# Patient Record
Sex: Female | Born: 1975 | Race: Black or African American | Hispanic: No | Marital: Single | Smoking: Never smoker
Health system: Southern US, Community
[De-identification: ages and names within clinical notes are randomized; demographics above are authoritative.]

## PROBLEM LIST (undated history)

## (undated) DIAGNOSIS — IMO0002 Reserved for concepts with insufficient information to code with codable children: Secondary | ICD-10-CM

## (undated) DIAGNOSIS — A749 Chlamydial infection, unspecified: Secondary | ICD-10-CM

## (undated) DIAGNOSIS — Z9889 Other specified postprocedural states: Secondary | ICD-10-CM

## (undated) DIAGNOSIS — Z8742 Personal history of other diseases of the female genital tract: Secondary | ICD-10-CM

## (undated) DIAGNOSIS — K219 Gastro-esophageal reflux disease without esophagitis: Secondary | ICD-10-CM

## (undated) DIAGNOSIS — N921 Excessive and frequent menstruation with irregular cycle: Secondary | ICD-10-CM

## (undated) DIAGNOSIS — B9689 Other specified bacterial agents as the cause of diseases classified elsewhere: Secondary | ICD-10-CM

## (undated) DIAGNOSIS — B379 Candidiasis, unspecified: Secondary | ICD-10-CM

## (undated) DIAGNOSIS — N926 Irregular menstruation, unspecified: Secondary | ICD-10-CM

## (undated) DIAGNOSIS — A499 Bacterial infection, unspecified: Secondary | ICD-10-CM

## (undated) DIAGNOSIS — N870 Mild cervical dysplasia: Secondary | ICD-10-CM

## (undated) DIAGNOSIS — A599 Trichomoniasis, unspecified: Secondary | ICD-10-CM

## (undated) DIAGNOSIS — D219 Benign neoplasm of connective and other soft tissue, unspecified: Secondary | ICD-10-CM

## (undated) DIAGNOSIS — Z8619 Personal history of other infectious and parasitic diseases: Secondary | ICD-10-CM

## (undated) DIAGNOSIS — R339 Retention of urine, unspecified: Secondary | ICD-10-CM

## (undated) DIAGNOSIS — N760 Acute vaginitis: Secondary | ICD-10-CM

## (undated) HISTORY — DX: Acute vaginitis: N76.0

## (undated) HISTORY — DX: Personal history of other diseases of the female genital tract: Z87.42

## (undated) HISTORY — DX: Other specified postprocedural states: Z98.890

## (undated) HISTORY — DX: Candidiasis, unspecified: B37.9

## (undated) HISTORY — DX: Personal history of other infectious and parasitic diseases: Z86.19

## (undated) HISTORY — DX: Excessive and frequent menstruation with irregular cycle: N92.1

## (undated) HISTORY — DX: Chlamydial infection, unspecified: A74.9

## (undated) HISTORY — PX: CERVICAL BIOPSY  W/ LOOP ELECTRODE EXCISION: SUR135

## (undated) HISTORY — DX: Trichomoniasis, unspecified: A59.9

## (undated) HISTORY — DX: Benign neoplasm of connective and other soft tissue, unspecified: D21.9

## (undated) HISTORY — DX: Reserved for concepts with insufficient information to code with codable children: IMO0002

## (undated) HISTORY — DX: Bacterial infection, unspecified: A49.9

## (undated) HISTORY — DX: Irregular menstruation, unspecified: N92.6

## (undated) HISTORY — PX: DILATION AND CURETTAGE OF UTERUS: SHX78

## (undated) HISTORY — DX: Mild cervical dysplasia: N87.0

## (undated) HISTORY — DX: Other specified bacterial agents as the cause of diseases classified elsewhere: B96.89

---

## 1999-07-11 ENCOUNTER — Emergency Department (HOSPITAL_COMMUNITY): Admission: EM | Admit: 1999-07-11 | Discharge: 1999-07-11 | Payer: Self-pay | Admitting: Emergency Medicine

## 2000-03-02 ENCOUNTER — Other Ambulatory Visit: Admission: RE | Admit: 2000-03-02 | Discharge: 2000-03-02 | Payer: Self-pay | Admitting: Obstetrics & Gynecology

## 2000-07-16 ENCOUNTER — Encounter: Payer: Self-pay | Admitting: Emergency Medicine

## 2000-07-16 ENCOUNTER — Emergency Department (HOSPITAL_COMMUNITY): Admission: EM | Admit: 2000-07-16 | Discharge: 2000-07-16 | Payer: Self-pay | Admitting: Emergency Medicine

## 2000-09-15 ENCOUNTER — Emergency Department (HOSPITAL_COMMUNITY): Admission: EM | Admit: 2000-09-15 | Discharge: 2000-09-15 | Payer: Self-pay | Admitting: Emergency Medicine

## 2002-05-31 ENCOUNTER — Other Ambulatory Visit: Admission: RE | Admit: 2002-05-31 | Discharge: 2002-05-31 | Payer: Self-pay | Admitting: Obstetrics and Gynecology

## 2002-08-02 DIAGNOSIS — IMO0002 Reserved for concepts with insufficient information to code with codable children: Secondary | ICD-10-CM

## 2002-08-02 DIAGNOSIS — R87619 Unspecified abnormal cytological findings in specimens from cervix uteri: Secondary | ICD-10-CM

## 2002-08-02 HISTORY — DX: Unspecified abnormal cytological findings in specimens from cervix uteri: R87.619

## 2002-08-02 HISTORY — DX: Reserved for concepts with insufficient information to code with codable children: IMO0002

## 2003-01-08 ENCOUNTER — Other Ambulatory Visit: Admission: RE | Admit: 2003-01-08 | Discharge: 2003-01-08 | Payer: Self-pay | Admitting: Obstetrics and Gynecology

## 2003-07-06 ENCOUNTER — Other Ambulatory Visit: Admission: RE | Admit: 2003-07-06 | Discharge: 2003-07-06 | Payer: Self-pay | Admitting: Obstetrics and Gynecology

## 2003-07-09 ENCOUNTER — Other Ambulatory Visit: Admission: RE | Admit: 2003-07-09 | Discharge: 2003-07-09 | Payer: Self-pay | Admitting: Obstetrics and Gynecology

## 2004-09-07 ENCOUNTER — Other Ambulatory Visit: Admission: RE | Admit: 2004-09-07 | Discharge: 2004-09-07 | Payer: Self-pay | Admitting: Obstetrics and Gynecology

## 2004-09-08 ENCOUNTER — Other Ambulatory Visit: Admission: RE | Admit: 2004-09-08 | Discharge: 2004-09-08 | Payer: Self-pay | Admitting: Obstetrics and Gynecology

## 2005-09-06 ENCOUNTER — Other Ambulatory Visit: Admission: RE | Admit: 2005-09-06 | Discharge: 2005-09-06 | Payer: Self-pay | Admitting: Obstetrics and Gynecology

## 2005-10-25 ENCOUNTER — Ambulatory Visit: Payer: Self-pay | Admitting: Internal Medicine

## 2005-12-31 DIAGNOSIS — N760 Acute vaginitis: Secondary | ICD-10-CM

## 2005-12-31 DIAGNOSIS — B9689 Other specified bacterial agents as the cause of diseases classified elsewhere: Secondary | ICD-10-CM

## 2005-12-31 HISTORY — DX: Other specified bacterial agents as the cause of diseases classified elsewhere: N76.0

## 2005-12-31 HISTORY — DX: Other specified bacterial agents as the cause of diseases classified elsewhere: B96.89

## 2006-01-10 DIAGNOSIS — D219 Benign neoplasm of connective and other soft tissue, unspecified: Secondary | ICD-10-CM

## 2006-01-10 HISTORY — DX: Benign neoplasm of connective and other soft tissue, unspecified: D21.9

## 2006-09-23 DIAGNOSIS — N921 Excessive and frequent menstruation with irregular cycle: Secondary | ICD-10-CM

## 2006-09-23 HISTORY — DX: Excessive and frequent menstruation with irregular cycle: N92.1

## 2006-10-18 ENCOUNTER — Ambulatory Visit: Payer: Self-pay | Admitting: Internal Medicine

## 2006-10-18 LAB — CONVERTED CEMR LAB: hCG, Beta Chain, Quant, S: <0.5 milliintl units/mL

## 2007-01-07 ENCOUNTER — Inpatient Hospital Stay (HOSPITAL_COMMUNITY): Admission: AD | Admit: 2007-01-07 | Discharge: 2007-01-07 | Payer: Self-pay | Admitting: Obstetrics and Gynecology

## 2007-01-10 ENCOUNTER — Encounter: Payer: Self-pay | Admitting: Internal Medicine

## 2007-01-10 LAB — CONVERTED CEMR LAB: hCG, Beta Chain, Quant, S: 535.38 milliintl units/mL

## 2007-06-23 ENCOUNTER — Ambulatory Visit: Payer: Self-pay | Admitting: Internal Medicine

## 2007-06-23 LAB — CONVERTED CEMR LAB

## 2007-08-10 ENCOUNTER — Telehealth: Payer: Self-pay | Admitting: Internal Medicine

## 2007-09-28 DIAGNOSIS — Z8742 Personal history of other diseases of the female genital tract: Secondary | ICD-10-CM

## 2007-09-28 HISTORY — DX: Personal history of other diseases of the female genital tract: Z87.42

## 2008-02-20 ENCOUNTER — Encounter: Payer: Self-pay | Admitting: Internal Medicine

## 2008-02-20 DIAGNOSIS — N912 Amenorrhea, unspecified: Secondary | ICD-10-CM

## 2008-02-21 ENCOUNTER — Ambulatory Visit: Payer: Self-pay | Admitting: Internal Medicine

## 2008-02-21 LAB — CONVERTED CEMR LAB: hCG, Beta Chain, Quant, S: 0.55 milliintl units/mL

## 2008-03-12 ENCOUNTER — Emergency Department (HOSPITAL_COMMUNITY): Admission: EM | Admit: 2008-03-12 | Discharge: 2008-03-12 | Payer: Self-pay | Admitting: Family Medicine

## 2008-03-19 ENCOUNTER — Encounter: Payer: Self-pay | Admitting: Internal Medicine

## 2008-12-09 ENCOUNTER — Encounter: Admission: RE | Admit: 2008-12-09 | Discharge: 2008-12-09 | Payer: Self-pay | Admitting: Obstetrics and Gynecology

## 2010-07-15 ENCOUNTER — Ambulatory Visit (HOSPITAL_COMMUNITY): Admission: RE | Admit: 2010-07-15 | Payer: Self-pay | Admitting: Obstetrics and Gynecology

## 2011-04-30 LAB — KOH PREP: KOH Prep: NONE SEEN

## 2011-04-30 LAB — CULTURE, FUNGUS WITHOUT SMEAR

## 2011-05-20 LAB — CBC
MCHC: 32.9
MCV: 86.6
RBC: 3.98
RDW: 15.2 — ABNORMAL HIGH

## 2011-05-20 LAB — HCG, QUANTITATIVE, PREGNANCY: hCG, Beta Chain, Quant, S: 1559 — ABNORMAL HIGH

## 2011-05-31 DIAGNOSIS — N870 Mild cervical dysplasia: Secondary | ICD-10-CM

## 2011-05-31 HISTORY — DX: Mild cervical dysplasia: N87.0

## 2011-12-02 ENCOUNTER — Encounter: Payer: Self-pay | Admitting: Obstetrics and Gynecology

## 2011-12-03 ENCOUNTER — Encounter: Payer: Self-pay | Admitting: Obstetrics and Gynecology

## 2011-12-16 ENCOUNTER — Encounter: Payer: Self-pay | Admitting: Obstetrics and Gynecology

## 2011-12-16 DIAGNOSIS — Z8742 Personal history of other diseases of the female genital tract: Secondary | ICD-10-CM | POA: Insufficient documentation

## 2011-12-16 DIAGNOSIS — D219 Benign neoplasm of connective and other soft tissue, unspecified: Secondary | ICD-10-CM | POA: Insufficient documentation

## 2011-12-16 DIAGNOSIS — Z87898 Personal history of other specified conditions: Secondary | ICD-10-CM

## 2011-12-16 DIAGNOSIS — B009 Herpesviral infection, unspecified: Secondary | ICD-10-CM | POA: Insufficient documentation

## 2011-12-17 ENCOUNTER — Encounter: Payer: Self-pay | Admitting: Obstetrics and Gynecology

## 2011-12-17 ENCOUNTER — Ambulatory Visit (INDEPENDENT_AMBULATORY_CARE_PROVIDER_SITE_OTHER): Payer: 59 | Admitting: Obstetrics and Gynecology

## 2011-12-17 VITALS — BP 102/62 | Ht 63.0 in | Wt 150.0 lb

## 2011-12-17 DIAGNOSIS — N920 Excessive and frequent menstruation with regular cycle: Secondary | ICD-10-CM

## 2011-12-17 DIAGNOSIS — Z124 Encounter for screening for malignant neoplasm of cervix: Secondary | ICD-10-CM

## 2011-12-17 DIAGNOSIS — Z139 Encounter for screening, unspecified: Secondary | ICD-10-CM

## 2011-12-17 LAB — CBC
MCV: 71.6 fL — ABNORMAL LOW (ref 78.0–100.0)
Platelets: 343 10*3/uL (ref 150–400)
RDW: 20.5 % — ABNORMAL HIGH (ref 11.5–15.5)
WBC: 6.1 10*3/uL (ref 4.0–10.5)

## 2011-12-17 LAB — PROLACTIN: Prolactin: 8.2 ng/mL

## 2011-12-17 MED ORDER — TOLTERODINE TARTRATE ER 4 MG PO CP24: 4.0000 mg | ORAL_CAPSULE | Freq: Every day | ORAL | Status: DC

## 2011-12-17 MED ORDER — CYCLOBENZAPRINE HCL 10 MG PO TABS: 10.0000 mg | ORAL_TABLET | Freq: Three times a day (TID) | ORAL | Status: AC | PRN

## 2011-12-17 NOTE — Progress Notes (Signed)
Pt here for repeat pap.  Also requests flexeril secondary to "butt cramps and leg cramps" while on cycle.  Then reports heavy cycles changing pad q1hr the first day with no relief form Lysteda.  Filed Vitals:   12/17/11 1117  BP: 102/62   ROS: noncontributory  Pelvic exam:  VULVA: normal appearing vulva with no masses, tenderness or lesions,  VAGINA: normal appearing vagina with normal color and discharge, no lesions, CERVIX: normal appearing cervix without discharge or lesions,  UTERUS: uterus is 20wk size and bulky, shape, consistency and nontender,  ADNEXA: normal adnexa in size, nontender and no masses.  A/P U/s Labs EmBx at [facility] next available Rx Flexeril and  Reviewed Mgmt options for fibroids - Pt does not want anymore children

## 2011-12-20 ENCOUNTER — Telehealth: Payer: Self-pay

## 2011-12-20 NOTE — Telephone Encounter (Signed)
Pt was called and given Vit-d protocol. RX was called in to [facility] for Vit-d softgels 50,000 units 1 capsule 2 x wk for 8 wks #28 0 rf. Pt was also advised of low iron and to take OTC FeS04  325 mg 1 bid with colace. Future lab ordered. Mathis Bud

## 2011-12-23 LAB — PAP IG W/ RFLX HPV ASCU

## 2012-01-03 ENCOUNTER — Encounter: Payer: Self-pay | Admitting: Obstetrics and Gynecology

## 2012-01-03 ENCOUNTER — Ambulatory Visit (INDEPENDENT_AMBULATORY_CARE_PROVIDER_SITE_OTHER): Payer: 59

## 2012-01-03 ENCOUNTER — Ambulatory Visit (INDEPENDENT_AMBULATORY_CARE_PROVIDER_SITE_OTHER): Payer: 59 | Admitting: Obstetrics and Gynecology

## 2012-01-03 ENCOUNTER — Other Ambulatory Visit: Payer: Self-pay | Admitting: Obstetrics and Gynecology

## 2012-01-03 VITALS — BP 100/60 | Ht 64.0 in | Wt 148.0 lb

## 2012-01-03 DIAGNOSIS — N920 Excessive and frequent menstruation with regular cycle: Secondary | ICD-10-CM

## 2012-01-03 DIAGNOSIS — N926 Irregular menstruation, unspecified: Secondary | ICD-10-CM

## 2012-01-03 DIAGNOSIS — D259 Leiomyoma of uterus, unspecified: Secondary | ICD-10-CM

## 2012-01-03 DIAGNOSIS — D219 Benign neoplasm of connective and other soft tissue, unspecified: Secondary | ICD-10-CM

## 2012-01-03 LAB — POCT URINE PREGNANCY: Preg Test, Ur: NEGATIVE

## 2012-01-03 NOTE — Progress Notes (Signed)
Pt here for EmBx.  She is taking iron and vit D. Took aleve before visit today.  Filed Vitals:   01/03/12 1420  BP: 100/60   ROS: noncontributory  Abdomen: 20wk size uterus  Pelvic exam:  VULVA: normal appearing vulva with no masses, tenderness or lesions,  VAGINA: normal appearing vagina with normal color and discharge, no lesions, CERVIX: normal appearing cervix without discharge or lesions,  UTERUS: uterus is 20wk size, shape, consistency and nontender,  ADNEXA: normal adnexa in size, nontender and no masses.  EmBx performed per protocol Pipelle passed x 2  to 18 to 19 cm  A/P Options and recs reviewed Rec TAH.  Pt is agreeable R/B/A discussed Pt agreeable to blood transfusion if needed Cont Fe

## 2012-01-05 NOTE — Telephone Encounter (Signed)
Done

## 2012-01-07 ENCOUNTER — Telehealth: Payer: Self-pay | Admitting: Obstetrics and Gynecology

## 2012-01-26 ENCOUNTER — Other Ambulatory Visit: Payer: Self-pay | Admitting: Obstetrics and Gynecology

## 2012-02-18 ENCOUNTER — Encounter (HOSPITAL_COMMUNITY): Payer: Self-pay

## 2012-02-21 ENCOUNTER — Encounter: Payer: Self-pay | Admitting: Obstetrics and Gynecology

## 2012-02-21 ENCOUNTER — Ambulatory Visit (INDEPENDENT_AMBULATORY_CARE_PROVIDER_SITE_OTHER): Payer: Commercial Managed Care - PPO | Admitting: Obstetrics and Gynecology

## 2012-02-21 VITALS — BP 100/70 | HR 72 | Temp 98.5°F | Resp 14 | Ht 63.5 in | Wt 149.0 lb

## 2012-02-21 DIAGNOSIS — IMO0001 Reserved for inherently not codable concepts without codable children: Secondary | ICD-10-CM

## 2012-02-21 DIAGNOSIS — R350 Frequency of micturition: Secondary | ICD-10-CM

## 2012-02-21 DIAGNOSIS — N920 Excessive and frequent menstruation with regular cycle: Secondary | ICD-10-CM

## 2012-02-21 DIAGNOSIS — Z01818 Encounter for other preprocedural examination: Secondary | ICD-10-CM

## 2012-02-21 DIAGNOSIS — N921 Excessive and frequent menstruation with irregular cycle: Secondary | ICD-10-CM

## 2012-02-21 LAB — POCT URINALYSIS DIPSTICK
Bilirubin, UA: NEGATIVE
Blood, UA: NEGATIVE
Glucose, UA: NEGATIVE
Spec Grav, UA: 1.01
Urobilinogen, UA: NEGATIVE
pH, UA: 6

## 2012-02-21 NOTE — Progress Notes (Signed)
Casey Lloyd is a 36 y.o. female G2P1001 who presents for a total abdominal hysterectomy because of menorrhagia, irregular bleeding and symptomatic uterine fibroids. Patient reports that for the past year her menses has lasted from 10-14 days and requires her to change a super pad + a super tampon 8 times a day.  Additionally she has cramping rated at 10/10 but will decrease to 3/10 with Aleve.  She admits to intermenstrual bleeding, post-coital spotting, dyspareunia, nocturia, lower back pain and discomfort with bowel movements.  She denies dysuria, vaginitis symptoms, hematuria or fever.  She was given Lysteda for her heavy bleeding with good results and used Flomax for her nocturia (5 times at night) also with good result. In May 2013 patient's TSH was normal but her H/H were 7.8 and 27.8 respectively.  An endometrial biopsy done in June of this year revealed benign fragments of endometrial polyps but no evidence of atypia, malignancy or hyperplasia.   Pelvic U/S (03/2011 uterus: 13.92 x 9.31 x 9.14 cm, #7 measurable fibroids: fundal-4.5 cm, right intramural-2.7 cm, right subserosal 3.5 cm, mid intramural 5.59 cm, posterior subserosal-3.49 cm, left lateral subserosal 3.65 cm and left anterior intramural 3.0 cm,; both of patient's ovaries appeared normal on that study. A review of both medical and surgical management options were given to  patient for management of her symptoms however, she has decided to proceed with definitive therapy in the form of hysterectomy.  Past Medical History  OB History: G2P1001 C-section 1998  GYN History: menarche 36 YO    LMP 01/31/2012    Contracepton condoms  history of chlamydia and HPV.  Last PAP smear 03/2011 LGSIL with colposcopy revealing CIN-1  Medical History: anemia, gastroesophageal reflux disease, right sciatica  and vitamin D deficiency   Surgical History: 2003 Loop Electrosurgical Excision Procedure, 2010 Hysteroscopy D & C (simple hyperplasia without  atypia) Denies problems with anesthesia or history of blood transfusions  Family History: hypertension, liver & prostate cancer  Social History:   Single, employed by [employer] in [place]; Denies tobacco or illicit drug use and rarely uses alcohol   Outpatient Encounter Prescriptions as of 02/21/2012  Medication Sig Dispense Refill  . cyclobenzaprine (FLEXERIL) 10 MG tablet Take 1 tablet (10 mg total) by mouth every 8 (eight) hours as needed for muscle spasms.  30 tablet  1  . ergocalciferol (VITAMIN D2) 50000 UNITS capsule Take 50,000 Units by mouth 2 (two) times a week. Mondays/Thursday      . ferrous sulfate 325 (65 FE) MG tablet Take 325 mg by mouth daily with breakfast.      . naproxen sodium (ANAPROX) 220 MG tablet Take 660 mg by mouth daily as needed. Pelvic pain        No Known Allergies but sensitive to latex and adhesives Denies sensitivity to peanuts, shellfish, soy  ROS: + glasses/contact lenses, has occasional nausea with reflux, constipation, lower extremity aches, nocturia and right sided sciatic nerve pain;   denies headache, vision changes, dysphagia, tinnitus, dizziness,  chest pain, shortness of breath, nausea, vomiting, diarrhea, dysuria, hematuria, pelvic pain, swelling of joints,easy bruising,  myalgias, arthralgias, skin rashes and except as is mentioned in the history of present illness, patient's review of systems is otherwise negative  Physical Exam    BP 100/70  Pulse 72  Temp 98.5 F (36.9 C) (Oral)  Resp 14  Ht 5' 3.5" (1.613 m)  Wt 149 lb (67.586 kg)  BMI 25.98 kg/m2  LMP 01/31/2012  Neck: supple   without masses or thyromegaly Lungs: clear to auscultation Heart: regular rate and rhythm Abdomen:  firm mass from pelvis to level of umbilicus, slightly tender and not mobile Pelvic: EGBUS-wnl, vagina-normal rugae, cervix without lesions or motion tenderness and displaced to left; uterus 22-24 weeks size, immobile and slightly tender;   adnexae-difficult to assess due to mass effect of uterus Extremities:  no clubbing, cyanosis or edema   Assesment: Symptomatic Uterine Fibroids                      Menometrorrhagia   Disposition:  A discussion was held with patient regarding the indication for her procedure(s) along with the risks, which include but are not limited to: reaction to anesthesia, damage to adjacent organs, infection,  excessive bleeding, early menopause and pelvic prolapse.  Patient verbalized understanding of these risks and has consented to a blood transfusion if needed and to a total abdominal hysterectomy at [facility] on March 02, 2012.   CSN# [id number]   Bryn Saline J. Gari Trovato, PA-C  for Dr. Roberts    

## 2012-02-23 NOTE — H&P (Signed)
Casey Lloyd is a 36 y.o. female G2P1001 who presents for a total abdominal hysterectomy because of menorrhagia, irregular bleeding and symptomatic uterine fibroids. Patient reports that for the past year her menses has lasted from 10-14 days and requires her to change a super pad + a super tampon 8 times a day.  Additionally she has cramping rated at 10/10 but will decrease to 3/10 with Aleve.  She admits to intermenstrual bleeding, post-coital spotting, dyspareunia, nocturia, lower back pain and discomfort with bowel movements.  She denies dysuria, vaginitis symptoms, hematuria or fever.  She was given Lysteda for her heavy bleeding with good results and used Flomax for her nocturia (5 times at night) also with good result. In May 2013 patient's TSH was normal but her H/H were 7.8 and 27.8 respectively.  An endometrial biopsy done in June of this year revealed benign fragments of endometrial polyps but no evidence of atypia, malignancy or hyperplasia.   Pelvic U/S (03/2011 uterus: 13.92 x 9.31 x 9.14 cm, #7 measurable fibroids: fundal-4.5 cm, right intramural-2.7 cm, right subserosal 3.5 cm, mid intramural 5.59 cm, posterior subserosal-3.49 cm, left lateral subserosal 3.65 cm and left anterior intramural 3.0 cm,; both of patient's ovaries appeared normal on that study. A review of both medical and surgical management options were given to  patient for management of her symptoms however, she has decided to proceed with definitive therapy in the form of hysterectomy.  Past Medical History  OB History: G2P1001 C-section 1998  GYN History: menarche 36 YO    LMP 01/31/2012    Contracepton condoms  history of chlamydia and HPV.  Last PAP smear 03/2011 LGSIL with colposcopy revealing CIN-1  Medical History: anemia, gastroesophageal reflux disease, right sciatica  and vitamin D deficiency   Surgical History: 2003 Loop Electrosurgical Excision Procedure, 2010 Hysteroscopy D & C (simple hyperplasia without  atypia) Denies problems with anesthesia or history of blood transfusions  Family History: hypertension, liver & prostate cancer  Social History:   Single, employed by [employer] in [place]; Denies tobacco or illicit drug use and rarely uses alcohol   Outpatient Encounter Prescriptions as of 02/21/2012  Medication Sig Dispense Refill  . cyclobenzaprine (FLEXERIL) 10 MG tablet Take 1 tablet (10 mg total) by mouth every 8 (eight) hours as needed for muscle spasms.  30 tablet  1  . ergocalciferol (VITAMIN D2) 50000 UNITS capsule Take 50,000 Units by mouth 2 (two) times a week. Mondays/Thursday      . ferrous sulfate 325 (65 FE) MG tablet Take 325 mg by mouth daily with breakfast.      . naproxen sodium (ANAPROX) 220 MG tablet Take 660 mg by mouth daily as needed. Pelvic pain        No Known Allergies but sensitive to latex and adhesives Denies sensitivity to peanuts, shellfish, soy  ROS: + glasses/contact lenses, has occasional nausea with reflux, constipation, lower extremity aches, nocturia and right sided sciatic nerve pain;   denies headache, vision changes, dysphagia, tinnitus, dizziness,  chest pain, shortness of breath, nausea, vomiting, diarrhea, dysuria, hematuria, pelvic pain, swelling of joints,easy bruising,  myalgias, arthralgias, skin rashes and except as is mentioned in the history of present illness, patient's review of systems is otherwise negative  Physical Exam    BP 100/70  Pulse 72  Temp 98.5 F (36.9 C) (Oral)  Resp 14  Ht 5' 3.5" (1.613 m)  Wt 149 lb (67.586 kg)  BMI 25.98 kg/m2  LMP 01/31/2012  Neck: supple   without masses or thyromegaly Lungs: clear to auscultation Heart: regular rate and rhythm Abdomen:  firm mass from pelvis to level of umbilicus, slightly tender and not mobile Pelvic: EGBUS-wnl, vagina-normal rugae, cervix without lesions or motion tenderness and displaced to left; uterus 22-24 weeks size, immobile and slightly tender;   adnexae-difficult to assess due to mass effect of uterus Extremities:  no clubbing, cyanosis or edema   Assesment: Symptomatic Uterine Fibroids                      Menometrorrhagia   Disposition:  A discussion was held with patient regarding the indication for her procedure(s) along with the risks, which include but are not limited to: reaction to anesthesia, damage to adjacent organs, infection,  excessive bleeding, early menopause and pelvic prolapse.  Patient verbalized understanding of these risks and has consented to a blood transfusion if needed and to a total abdominal hysterectomy at [facility] on March 02, 2012.   CSN# [id number]   Rebel Laughridge J. Kaitlin Alcindor, PA-C  for Dr. Roberts    

## 2012-02-24 ENCOUNTER — Telehealth: Payer: Self-pay | Admitting: Obstetrics and Gynecology

## 2012-02-24 NOTE — Telephone Encounter (Signed)
TAH scheduled for 03/02/12 @ 9:30 with AR/ND.  [facility] effective 08/02/09.  Plan pays 70/30. Precert required and obtained [id number]. -Adrianne Pridgen

## 2012-02-25 ENCOUNTER — Encounter (HOSPITAL_COMMUNITY)
Admission: RE | Admit: 2012-02-25 | Discharge: 2012-02-25 | Disposition: A | Discharge status: Home / Self Care | Payer: Commercial Managed Care - PPO | Source: Ambulatory Visit | Attending: Obstetrics and Gynecology | Admitting: Obstetrics and Gynecology

## 2012-02-25 ENCOUNTER — Other Ambulatory Visit: Payer: Self-pay | Admitting: Obstetrics and Gynecology

## 2012-02-25 ENCOUNTER — Encounter (HOSPITAL_COMMUNITY): Payer: Self-pay

## 2012-02-25 HISTORY — DX: Gastro-esophageal reflux disease without esophagitis: K21.9

## 2012-02-25 HISTORY — DX: Retention of urine, unspecified: R33.9

## 2012-02-25 LAB — CBC
HCT: 27.7 % — ABNORMAL LOW (ref 36.0–46.0)
Hemoglobin: 8.1 g/dL — ABNORMAL LOW (ref 12.0–15.0)
WBC: 4.3 10*3/uL (ref 4.0–10.5)

## 2012-02-25 LAB — SURGICAL PCR SCREEN: Staphylococcus aureus: NEGATIVE

## 2012-02-25 NOTE — Pre-Procedure Instructions (Signed)
Hgb result of 8.1 given to Dr Rodman Pickle. Instructed to notifiy Dr Su Hilt. Message left on Adrianne's voice mail to return my call soon as possible.

## 2012-02-25 NOTE — Patient Instructions (Addendum)
[name]  02/25/2012   Your procedure is scheduled on:  03/02/12  Enter through the Main Entrance of [facility] at 8 AM.  Pick up the phone at the desk and dial [phone].   Call this number if you have problems the morning of surgery: [phone]   Remember:   Do not eat food:After Midnight.  Do not drink clear liquids: After Midnight.  Take these medicines the morning of surgery with A SIP OF WATER: NA   Do not wear jewelry, make-up or nail polish.  Do not wear lotions, powders, or perfumes. You may wear deodorant.  Do not shave 48 hours prior to surgery.  Do not bring valuables to the hospital.  Contacts, dentures or bridgework may not be worn into surgery.  Leave suitcase in the car. After surgery it may be brought to your room.  For patients admitted to the hospital, checkout time is 11:00 AM the day of discharge.   Patients discharged the day of surgery will not be allowed to drive home.  Name and phone number of your driver: NA  Special Instructions: CHG Shower Use Special Wash: 1/2 bottle night before surgery and 1/2 bottle morning of surgery.   Please read over the following fact sheets that you were given: MRSA Information

## 2012-02-25 NOTE — Pre-Procedure Instructions (Signed)
Unable to reach anyone via MD office phones. Message of 8.1 HGB called to [facility], NP. She is to follow -up with this. No orders given.

## 2012-03-01 MED ORDER — CEFOTETAN DISODIUM 2 G IJ SOLR
2.0000 g | INTRAMUSCULAR | Status: AC
  Administered 2012-03-02: 2 g via INTRAVENOUS
  Filled 2012-03-01: qty 2

## 2012-03-02 ENCOUNTER — Inpatient Hospital Stay (HOSPITAL_COMMUNITY)
Admission: RE | Admit: 2012-03-02 | Discharge: 2012-03-04 | DRG: 743 | Disposition: A | Discharge status: Home / Self Care | Payer: Commercial Managed Care - PPO | Source: Ambulatory Visit | Attending: Obstetrics and Gynecology | Admitting: Obstetrics and Gynecology

## 2012-03-02 ENCOUNTER — Encounter (HOSPITAL_COMMUNITY): Payer: Self-pay | Admitting: *Deleted

## 2012-03-02 ENCOUNTER — Ambulatory Visit (HOSPITAL_COMMUNITY): Payer: Commercial Managed Care - PPO | Admitting: Anesthesiology

## 2012-03-02 ENCOUNTER — Encounter (HOSPITAL_COMMUNITY): Payer: Self-pay | Admitting: Anesthesiology

## 2012-03-02 ENCOUNTER — Encounter (HOSPITAL_COMMUNITY)
Admission: RE | Disposition: A | Discharge status: Home / Self Care | Payer: Self-pay | Source: Ambulatory Visit | Attending: Obstetrics and Gynecology

## 2012-03-02 DIAGNOSIS — D259 Leiomyoma of uterus, unspecified: Secondary | ICD-10-CM

## 2012-03-02 DIAGNOSIS — N920 Excessive and frequent menstruation with regular cycle: Secondary | ICD-10-CM | POA: Diagnosis present

## 2012-03-02 DIAGNOSIS — IMO0002 Reserved for concepts with insufficient information to code with codable children: Secondary | ICD-10-CM | POA: Diagnosis present

## 2012-03-02 DIAGNOSIS — N809 Endometriosis, unspecified: Secondary | ICD-10-CM

## 2012-03-02 DIAGNOSIS — D252 Subserosal leiomyoma of uterus: Secondary | ICD-10-CM | POA: Diagnosis present

## 2012-03-02 DIAGNOSIS — D251 Intramural leiomyoma of uterus: Principal | ICD-10-CM | POA: Diagnosis present

## 2012-03-02 DIAGNOSIS — N926 Irregular menstruation, unspecified: Secondary | ICD-10-CM | POA: Diagnosis present

## 2012-03-02 HISTORY — PX: CYSTOSCOPY: SHX5120

## 2012-03-02 HISTORY — PX: ABDOMINAL HYSTERECTOMY: SHX81

## 2012-03-02 LAB — CBC
HCT: 40 % (ref 36.0–46.0)
Hemoglobin: 12.8 g/dL (ref 12.0–15.0)
MCHC: 32 g/dL (ref 30.0–36.0)
RBC: 5.06 MIL/uL (ref 3.87–5.11)
WBC: 15.6 10*3/uL — ABNORMAL HIGH (ref 4.0–10.5)

## 2012-03-02 LAB — HCG, SERUM, QUALITATIVE: Preg, Serum: NEGATIVE

## 2012-03-02 LAB — PREPARE RBC (CROSSMATCH)

## 2012-03-02 LAB — ABO/RH: ABO/RH(D): A POS

## 2012-03-02 SURGERY — HYSTERECTOMY, ABDOMINAL
Anesthesia: General | Site: Bladder | Wound class: Clean Contaminated

## 2012-03-02 MED ORDER — KETOROLAC TROMETHAMINE 30 MG/ML IJ SOLN: 15.0000 mg | Freq: Once | INTRAMUSCULAR | Status: DC | PRN

## 2012-03-02 MED ORDER — PHENYLEPHRINE HCL 10 MG/ML IJ SOLN
INTRAMUSCULAR | Status: DC | PRN
  Administered 2012-03-02: 40 ug via INTRAVENOUS
  Administered 2012-03-02 (×2): 120 ug via INTRAVENOUS
  Administered 2012-03-02: 80 ug via INTRAVENOUS
  Administered 2012-03-02: 120 ug via INTRAVENOUS
  Administered 2012-03-02 (×2): 80 ug via INTRAVENOUS

## 2012-03-02 MED ORDER — HYDROMORPHONE 0.3 MG/ML IV SOLN
INTRAVENOUS | Status: DC
  Administered 2012-03-02: 18:00:00 via INTRAVENOUS
  Administered 2012-03-02: 0.599 mg via INTRAVENOUS
  Administered 2012-03-03: 0.6 mg via INTRAVENOUS
  Administered 2012-03-03: 0.599 mg via INTRAVENOUS
  Filled 2012-03-02: qty 25

## 2012-03-02 MED ORDER — SODIUM CHLORIDE 0.9 % IJ SOLN: 9.0000 mL | INTRAMUSCULAR | Status: DC | PRN

## 2012-03-02 MED ORDER — VASOPRESSIN 20 UNIT/ML IJ SOLN
INTRAVENOUS | Status: DC | PRN
  Administered 2012-03-02: 11:00:00 via INTRAMUSCULAR

## 2012-03-02 MED ORDER — NEOSTIGMINE METHYLSULFATE 1 MG/ML IJ SOLN
INTRAMUSCULAR | Status: DC | PRN
  Administered 2012-03-02: 4 mg via INTRAVENOUS

## 2012-03-02 MED ORDER — PROPOFOL 10 MG/ML IV EMUL: INTRAVENOUS | Status: DC | PRN

## 2012-03-02 MED ORDER — GLYCOPYRROLATE 0.2 MG/ML IJ SOLN
INTRAMUSCULAR | Status: DC | PRN
  Administered 2012-03-02: 0.4 mg via INTRAVENOUS
  Administered 2012-03-02: 0.1 mg via INTRAVENOUS

## 2012-03-02 MED ORDER — PROPOFOL 10 MG/ML IV EMUL
INTRAVENOUS | Status: DC | PRN
  Administered 2012-03-02: 150 mg via INTRAVENOUS

## 2012-03-02 MED ORDER — VASOPRESSIN 20 UNIT/ML IJ SOLN
INTRAMUSCULAR | Status: AC
  Filled 2012-03-02: qty 1

## 2012-03-02 MED ORDER — FENTANYL CITRATE 0.05 MG/ML IJ SOLN
INTRAMUSCULAR | Status: AC
  Filled 2012-03-02: qty 5

## 2012-03-02 MED ORDER — GLYCOPYRROLATE 0.2 MG/ML IJ SOLN
INTRAMUSCULAR | Status: AC
  Filled 2012-03-02: qty 1

## 2012-03-02 MED ORDER — LACTATED RINGERS IV SOLN
INTRAVENOUS | Status: DC
  Administered 2012-03-02: 09:00:00 via INTRAVENOUS

## 2012-03-02 MED ORDER — CEFAZOLIN SODIUM 1-5 GM-% IV SOLN
INTRAVENOUS | Status: DC | PRN
  Administered 2012-03-02: 2 g via INTRAVENOUS

## 2012-03-02 MED ORDER — CEFAZOLIN SODIUM-DEXTROSE 2-3 GM-% IV SOLR
INTRAVENOUS | Status: AC
  Filled 2012-03-02: qty 50

## 2012-03-02 MED ORDER — SODIUM CHLORIDE 0.9 % IV SOLN
INTRAVENOUS | Status: DC | PRN
  Administered 2012-03-02: 12:00:00 via INTRAVENOUS

## 2012-03-02 MED ORDER — IBUPROFEN 600 MG PO TABS
600.0000 mg | ORAL_TABLET | Freq: Four times a day (QID) | ORAL | Status: DC | PRN
  Administered 2012-03-03: 22:00:00 via ORAL
  Administered 2012-03-04: 600 mg via ORAL
  Filled 2012-03-02 (×3): qty 1

## 2012-03-02 MED ORDER — FENTANYL CITRATE 0.05 MG/ML IJ SOLN
INTRAMUSCULAR | Status: DC | PRN
  Administered 2012-03-02: 50 ug via INTRAVENOUS
  Administered 2012-03-02: 100 ug via INTRAVENOUS
  Administered 2012-03-02: 50 ug via INTRAVENOUS
  Administered 2012-03-02: 25 ug via INTRAVENOUS
  Administered 2012-03-02 (×4): 50 ug via INTRAVENOUS

## 2012-03-02 MED ORDER — PROPOFOL 10 MG/ML IV EMUL
INTRAVENOUS | Status: AC
  Filled 2012-03-02: qty 20

## 2012-03-02 MED ORDER — MIDAZOLAM HCL 5 MG/5ML IJ SOLN
INTRAMUSCULAR | Status: DC | PRN
  Administered 2012-03-02: 2 mg via INTRAVENOUS

## 2012-03-02 MED ORDER — KETOROLAC TROMETHAMINE 30 MG/ML IJ SOLN: 30.0000 mg | Freq: Once | INTRAMUSCULAR | Status: DC

## 2012-03-02 MED ORDER — INDIGOTINDISULFONATE SODIUM 8 MG/ML IJ SOLN
INTRAMUSCULAR | Status: AC
  Filled 2012-03-02: qty 5

## 2012-03-02 MED ORDER — FENTANYL CITRATE 0.05 MG/ML IJ SOLN
25.0000 ug | INTRAMUSCULAR | Status: DC | PRN
  Administered 2012-03-02 (×2): 50 ug via INTRAVENOUS

## 2012-03-02 MED ORDER — MIDAZOLAM HCL 2 MG/2ML IJ SOLN: 0.5000 mg | Freq: Once | INTRAMUSCULAR | Status: DC | PRN

## 2012-03-02 MED ORDER — LACTATED RINGERS IV SOLN
INTRAVENOUS | Status: DC
  Administered 2012-03-02 – 2012-03-03 (×3): via INTRAVENOUS

## 2012-03-02 MED ORDER — KETOROLAC TROMETHAMINE 30 MG/ML IJ SOLN
30.0000 mg | Freq: Four times a day (QID) | INTRAMUSCULAR | Status: AC
  Administered 2012-03-02 – 2012-03-03 (×3): 30 mg via INTRAVENOUS
  Filled 2012-03-02 (×3): qty 1

## 2012-03-02 MED ORDER — PROMETHAZINE HCL 25 MG/ML IJ SOLN: 6.2500 mg | INTRAMUSCULAR | Status: DC | PRN

## 2012-03-02 MED ORDER — PHENYLEPHRINE 40 MCG/ML (10ML) SYRINGE FOR IV PUSH (FOR BLOOD PRESSURE SUPPORT)
PREFILLED_SYRINGE | INTRAVENOUS | Status: AC
  Filled 2012-03-02: qty 5

## 2012-03-02 MED ORDER — ROCURONIUM BROMIDE 50 MG/5ML IV SOLN
INTRAVENOUS | Status: AC
  Filled 2012-03-02: qty 1

## 2012-03-02 MED ORDER — ONDANSETRON HCL 4 MG/2ML IJ SOLN: 4.0000 mg | Freq: Four times a day (QID) | INTRAMUSCULAR | Status: DC | PRN

## 2012-03-02 MED ORDER — BUPIVACAINE HCL (PF) 0.25 % IJ SOLN
INTRAMUSCULAR | Status: AC
  Filled 2012-03-02: qty 30

## 2012-03-02 MED ORDER — DIPHENHYDRAMINE HCL 50 MG/ML IJ SOLN: 12.5000 mg | Freq: Four times a day (QID) | INTRAMUSCULAR | Status: DC | PRN

## 2012-03-02 MED ORDER — HYDROMORPHONE HCL PF 1 MG/ML IJ SOLN
INTRAMUSCULAR | Status: AC
  Filled 2012-03-02: qty 1

## 2012-03-02 MED ORDER — MEPERIDINE HCL 25 MG/ML IJ SOLN: 6.2500 mg | INTRAMUSCULAR | Status: DC | PRN

## 2012-03-02 MED ORDER — LIDOCAINE HCL (CARDIAC) 20 MG/ML IV SOLN
INTRAVENOUS | Status: AC
  Filled 2012-03-02: qty 5

## 2012-03-02 MED ORDER — MIDAZOLAM HCL 2 MG/2ML IJ SOLN
INTRAMUSCULAR | Status: AC
  Filled 2012-03-02: qty 2

## 2012-03-02 MED ORDER — OXYCODONE-ACETAMINOPHEN 5-325 MG PO TABS
1.0000 | ORAL_TABLET | ORAL | Status: DC | PRN
  Administered 2012-03-03 (×2): 2 via ORAL
  Filled 2012-03-02 (×2): qty 2

## 2012-03-02 MED ORDER — NALOXONE HCL 0.4 MG/ML IJ SOLN: 0.4000 mg | INTRAMUSCULAR | Status: DC | PRN

## 2012-03-02 MED ORDER — ONDANSETRON HCL 4 MG/2ML IJ SOLN
INTRAMUSCULAR | Status: AC
  Filled 2012-03-02: qty 2

## 2012-03-02 MED ORDER — DEXAMETHASONE SODIUM PHOSPHATE 4 MG/ML IJ SOLN
INTRAMUSCULAR | Status: DC | PRN
  Administered 2012-03-02: 10 mg via INTRAVENOUS

## 2012-03-02 MED ORDER — DIPHENHYDRAMINE HCL 12.5 MG/5ML PO ELIX: 12.5000 mg | ORAL_SOLUTION | Freq: Four times a day (QID) | ORAL | Status: DC | PRN

## 2012-03-02 MED ORDER — FENTANYL CITRATE 0.05 MG/ML IJ SOLN
INTRAMUSCULAR | Status: AC
  Administered 2012-03-02: 50 ug via INTRAVENOUS
  Filled 2012-03-02: qty 2

## 2012-03-02 MED ORDER — LIDOCAINE HCL (CARDIAC) 20 MG/ML IV SOLN
INTRAVENOUS | Status: DC | PRN
  Administered 2012-03-02: 30 mg via INTRAVENOUS

## 2012-03-02 MED ORDER — DEXAMETHASONE SODIUM PHOSPHATE 10 MG/ML IJ SOLN
INTRAMUSCULAR | Status: AC
  Filled 2012-03-02: qty 1

## 2012-03-02 MED ORDER — INDIGOTINDISULFONATE SODIUM 8 MG/ML IJ SOLN
INTRAMUSCULAR | Status: DC | PRN
  Administered 2012-03-02: 40 mg via INTRAVENOUS

## 2012-03-02 MED ORDER — LACTATED RINGERS IV SOLN
INTRAVENOUS | Status: DC | PRN
  Administered 2012-03-02 (×3): via INTRAVENOUS

## 2012-03-02 MED ORDER — ROCURONIUM BROMIDE 100 MG/10ML IV SOLN
INTRAVENOUS | Status: DC | PRN
  Administered 2012-03-02: 30 mg via INTRAVENOUS
  Administered 2012-03-02: 10 mg via INTRAVENOUS
  Administered 2012-03-02 (×2): 20 mg via INTRAVENOUS

## 2012-03-02 MED ORDER — ONDANSETRON HCL 4 MG/2ML IJ SOLN
INTRAMUSCULAR | Status: DC | PRN
  Administered 2012-03-02: 4 mg via INTRAVENOUS

## 2012-03-02 MED ORDER — HYDROMORPHONE HCL PF 1 MG/ML IJ SOLN
INTRAMUSCULAR | Status: DC | PRN
  Administered 2012-03-02: 1 mg via INTRAVENOUS

## 2012-03-02 SURGICAL SUPPLY — 44 items
BARRIER ADHS 3X4 INTERCEED (GAUZE/BANDAGES/DRESSINGS) IMPLANT
BRR ADH 4X3 ABS CNTRL BYND (GAUZE/BANDAGES/DRESSINGS)
CANISTER SUCTION 2500CC (MISCELLANEOUS) ×3 IMPLANT
CHLORAPREP W/TINT 26ML (MISCELLANEOUS) ×3 IMPLANT
CLOTH BEACON ORANGE TIMEOUT ST (SAFETY) ×3 IMPLANT
CONT PATH 16OZ SNAP LID 3702 (MISCELLANEOUS) ×3 IMPLANT
GAUZE SPONGE 4X4 16PLY XRAY LF (GAUZE/BANDAGES/DRESSINGS) ×3 IMPLANT
GLOVE BIO SURGEON STRL SZ 6.5 (GLOVE) ×3 IMPLANT
GLOVE BIO SURGEON STRL SZ7.5 (GLOVE) ×6 IMPLANT
GLOVE BIOGEL PI IND STRL 7.0 (GLOVE) ×2 IMPLANT
GLOVE BIOGEL PI IND STRL 7.5 (GLOVE) ×2 IMPLANT
GLOVE BIOGEL PI INDICATOR 7.0 (GLOVE) ×2
GLOVE BIOGEL PI INDICATOR 7.5 (GLOVE) ×2
GLOVE SURG SS PI 7.0 STRL IVOR (GLOVE) ×1 IMPLANT
GOWN PREVENTION PLUS LG XLONG (DISPOSABLE) ×6 IMPLANT
GOWN STRL REIN XL XLG (GOWN DISPOSABLE) ×6 IMPLANT
HEMOSTAT SURGICEL 2X3 (HEMOSTASIS) ×3 IMPLANT
HEMOSTAT SURGICEL 4X8 (HEMOSTASIS) ×1 IMPLANT
NS IRRIG 1000ML POUR BTL (IV SOLUTION) ×3 IMPLANT
PACK LAVH (CUSTOM PROCEDURE TRAY) ×3 IMPLANT
PAD OB MATERNITY 4.3X12.25 (PERSONAL CARE ITEMS) ×3 IMPLANT
SET CYSTO W/LG BORE CLAMP LF (SET/KITS/TRAYS/PACK) ×1 IMPLANT
SPONGE LAP 18X18 X RAY DECT (DISPOSABLE) ×8 IMPLANT
STAPLER VISISTAT 35W (STAPLE) IMPLANT
SUT CHROMIC 2 0 CT 1 (SUTURE) ×3 IMPLANT
SUT CHROMIC 2 0 SH (SUTURE) IMPLANT
SUT MNCRL AB 3-0 PS2 27 (SUTURE) ×3 IMPLANT
SUT MON AB 3-0 SH 27 (SUTURE)
SUT MON AB 3-0 SH27 (SUTURE) IMPLANT
SUT PDS AB 1 CTX 36 (SUTURE) IMPLANT
SUT PLAIN 2 0 XLH (SUTURE) ×2 IMPLANT
SUT VIC AB 0 CT1 18XCR BRD8 (SUTURE) ×6 IMPLANT
SUT VIC AB 0 CT1 27 (SUTURE) ×6
SUT VIC AB 0 CT1 27XBRD ANBCTR (SUTURE) ×6 IMPLANT
SUT VIC AB 0 CT1 8-18 (SUTURE) ×6
SUT VIC AB 2-0 SH 27 (SUTURE) ×6
SUT VIC AB 2-0 SH 27XBRD (SUTURE) ×2 IMPLANT
SUT VIC AB 3-0 SH 27 (SUTURE) ×6
SUT VIC AB 3-0 SH 27XBRD (SUTURE) ×2 IMPLANT
SUT VICRYL 0 TIES 12 18 (SUTURE) ×3 IMPLANT
SYR CONTROL 10ML LL (SYRINGE) ×1 IMPLANT
TOWEL OR 17X24 6PK STRL BLUE (TOWEL DISPOSABLE) ×6 IMPLANT
TRAY FOLEY CATH 14FR (SET/KITS/TRAYS/PACK) ×3 IMPLANT
WATER STERILE IRR 1000ML POUR (IV SOLUTION) ×3 IMPLANT

## 2012-03-02 NOTE — Interval H&P Note (Signed)
History and Physical Interval Note:  03/02/2012 9:46 AM  Casey Lloyd  has presented today for surgery, with the diagnosis of Large Symptomatic Fibroids  The various methods of treatment have been discussed with the patient and family. After consideration of risks, benefits and other options for treatment, the patient has consented to  Procedure(s) (LRB): HYSTERECTOMY ABDOMINAL (N/A) as a surgical intervention .  The patient's history has been reviewed, patient examined, no change in status, stable for surgery.  I have reviewed the patient's chart and labs.  Questions were answered to the patient's satisfaction.     Purcell Nails

## 2012-03-02 NOTE — Transfer of Care (Signed)
Immediate Anesthesia Transfer of Care Note  Patient: Casey Lloyd  Procedure(s) Performed: Procedure(s) (LRB): HYSTERECTOMY ABDOMINAL (N/A) CYSTOSCOPY (N/A)  Patient Location: PACU  Anesthesia Type: General  Level of Consciousness: sedated, patient cooperative, lethargic and responds to stimulation  Airway & Oxygen Therapy: Patient Spontanous Breathing and Patient connected to nasal cannula oxygen  Post-op Assessment: Report given to PACU RN and Post -op Vital signs reviewed and stable  Post vital signs: Reviewed and stable  Complications: No apparent anesthesia complications

## 2012-03-02 NOTE — Anesthesia Postprocedure Evaluation (Signed)
Anesthesia Post Note  Patient: Casey Lloyd  Procedure(s) Performed: Procedure(s) (LRB): HYSTERECTOMY ABDOMINAL (N/A) CYSTOSCOPY (N/A)  Anesthesia type: General  Patient location: PACU  Post pain: Pain level controlled  Post assessment: Post-op Vital signs reviewed  Last Vitals:  Filed Vitals:   03/02/12 1408  BP: 145/81  Pulse: 78  Temp: 37.1 C  Resp: 16    Post vital signs: Reviewed  Level of consciousness: sedated  Complications: No apparent anesthesia complicationsfj

## 2012-03-02 NOTE — H&P (View-Only) (Signed)
Casey Lloyd is a 36 y.o. female G2P1001 who presents for a total abdominal hysterectomy because of menorrhagia, irregular bleeding and symptomatic uterine fibroids. Patient reports that for the past year her menses has lasted from 10-14 days and requires her to change a super pad + a super tampon 8 times a day.  Additionally she has cramping rated at 10/10 but will decrease to 3/10 with Aleve.  She admits to intermenstrual bleeding, post-coital spotting, dyspareunia, nocturia, lower back pain and discomfort with bowel movements.  She denies dysuria, vaginitis symptoms, hematuria or fever.  She was given Lysteda for her heavy bleeding with good results and used Flomax for her nocturia (5 times at night) also with good result. In May 2013 patient's TSH was normal but her H/H were 7.8 and 27.8 respectively.  An endometrial biopsy done in June of this year revealed benign fragments of endometrial polyps but no evidence of atypia, malignancy or hyperplasia.   Pelvic U/S (03/2011 uterus: 13.92 x 9.31 x 9.14 cm, #7 measurable fibroids: fundal-4.5 cm, right intramural-2.7 cm, right subserosal 3.5 cm, mid intramural 5.59 cm, posterior subserosal-3.49 cm, left lateral subserosal 3.65 cm and left anterior intramural 3.0 cm,; both of patient's ovaries appeared normal on that study. A review of both medical and surgical management options were given to  patient for management of her symptoms however, she has decided to proceed with definitive therapy in the form of hysterectomy.  Past Medical History  OB History: G2P1001 C-section 1998  GYN History: menarche 36 YO    LMP 01/31/2012    Contracepton condoms  history of chlamydia and HPV.  Last PAP smear 03/2011 LGSIL with colposcopy revealing CIN-1  Medical History: anemia, gastroesophageal reflux disease, right sciatica  and vitamin D deficiency   Surgical History: 2003 Loop Electrosurgical Excision Procedure, 2010 Hysteroscopy D & C (simple hyperplasia without  atypia) Denies problems with anesthesia or history of blood transfusions  Family History: hypertension, liver & prostate cancer  Social History:   Single, employed by [employer] in [place]; Denies tobacco or illicit drug use and rarely uses alcohol   Outpatient Encounter Prescriptions as of 02/21/2012  Medication Sig Dispense Refill  . cyclobenzaprine (FLEXERIL) 10 MG tablet Take 1 tablet (10 mg total) by mouth every 8 (eight) hours as needed for muscle spasms.  30 tablet  1  . ergocalciferol (VITAMIN D2) 50000 UNITS capsule Take 50,000 Units by mouth 2 (two) times a week. Mondays/Thursday      . ferrous sulfate 325 (65 FE) MG tablet Take 325 mg by mouth daily with breakfast.      . naproxen sodium (ANAPROX) 220 MG tablet Take 660 mg by mouth daily as needed. Pelvic pain        No Known Allergies but sensitive to latex and adhesives Denies sensitivity to peanuts, shellfish, soy  ROS: + glasses/contact lenses, has occasional nausea with reflux, constipation, lower extremity aches, nocturia and right sided sciatic nerve pain;   denies headache, vision changes, dysphagia, tinnitus, dizziness,  chest pain, shortness of breath, nausea, vomiting, diarrhea, dysuria, hematuria, pelvic pain, swelling of joints,easy bruising,  myalgias, arthralgias, skin rashes and except as is mentioned in the history of present illness, patient's review of systems is otherwise negative  Physical Exam    BP 100/70  Pulse 72  Temp 98.5 F (36.9 C) (Oral)  Resp 14  Ht 5' 3.5" (1.613 m)  Wt 149 lb (67.586 kg)  BMI 25.98 kg/m2  LMP 01/31/2012  Neck: supple  without masses or thyromegaly Lungs: clear to auscultation Heart: regular rate and rhythm Abdomen:  firm mass from pelvis to level of umbilicus, slightly tender and not mobile Pelvic: EGBUS-wnl, vagina-normal rugae, cervix without lesions or motion tenderness and displaced to left; uterus 22-24 weeks size, immobile and slightly tender;   adnexae-difficult to assess due to mass effect of uterus Extremities:  no clubbing, cyanosis or edema   Assesment: Symptomatic Uterine Fibroids                      Menometrorrhagia   Disposition:  A discussion was held with patient regarding the indication for her procedure(s) along with the risks, which include but are not limited to: reaction to anesthesia, damage to adjacent organs, infection,  excessive bleeding, early menopause and pelvic prolapse.  Patient verbalized understanding of these risks and has consented to a blood transfusion if needed and to a total abdominal hysterectomy at [facility] [place] on March 02, 2012.   CSN# [id number]   Ha Placeres J. Lowell Guitar, PA-C  for Dr. Su Hilt

## 2012-03-02 NOTE — Op Note (Addendum)
Preop Diagnosis: Large Symptomatic Fibroids   Postop Diagnosis: Large Symptomatic Fibroids   Procedure: SUPRACERVICAL ABDOMINAL HYSTERECTOMY and LOA  Anesthesia: General   Anesthesiologist: Brayton Caves, MD   Attending: Purcell Nails, MD   Assistant: Jaymes Graff, MD  Findings: Large fibroid uters about 20wk size with multiple fibroids.  Multiple adhesions of bowel and adnexa to uterus.  Dilated tubes and hard to visualize ovaries secondary to adhesions with the appearance of endometriosis.  Pathology: 1.Fibroid, Uterus with fibroids and portion of cervix.  2.Pelvic Mass  Fluids: See flowsheet  UOP: See flowsheet  EBL: See flowsheet..the patient received 4 units of PRBCs intraoperatively  Complications: None  Procedure: The patient received intravenous antibiotics and had sequential compression devices applied to her lower extremities while in the preoperative area.   She was taken to the operating room and placed under general anesthesia without difficulty.The abdomen and perineum were prepped and draped in a sterile manner, and she was placed in a dorsal lithotomy position.  A Foley catheter was inserted into the bladder and attached to constant drainage. After an adequate timeout was performed, a Pfannensteil skin incision was made. This incision was taken down to the fascia using electrocautery with care given to maintain good hemostasis. The fascia was incised in the midline and the fascial incision was then extended bilaterally using mayo scissors without difficulty. The fascia was then dissected off the underlying rectus muscles using blunt and sharp dissection. The rectus muscles were split bluntly in the midline and the peritoneum entered sharply without complication. This peritoneal incision was then extended superiorly and inferiorly with care given to prevent bowel or bladder injury. Attention was then turned to the pelvis. A retractor was placed into the  incision, and the bowel was packed away with 3 moist laparotomy sponges. The fascia was tight inferiorly and was cut for approx 1cm caudally in the midline.  The uterus at this point was noted to be mobilized and was delivered up out of the abdomen. The round ligaments on each side were clamped, suture ligated with 0 Vicryl, and transected with electrocautery. Of note, all sutures used in this procedure are 0 Vicryl unless otherwise noted.  Adnexae were clamped on the patient's left side, cut, and doubly suture ligated. This procedure was repeated in an identical fashion on the right side after taking down several adhesions.  The bladder was then bluntly and sharply dissected off the lower uterine segment and cervix with good hemostasis noted. The uterine arteries were then skeletonized bilaterally and then clamped, cut, and doubly suture ligated with care given to prevent ureteral injury.  The uterus was then amputated across the lower uterine segment leaving the cervix intact. The specimen was sent to pathology. The cervical canal was coagulated, and the anterior and posterior peritoneal edges were then reapproximated in the midline over the cervical stump without complication.  The pelvis was irrigated and hemostasis was reconfirmed at all pedicles and along the pelvic sidewall.  Surgicel was placed along some of the raw surfaces where dissection had taken place and there was slight oozing.  All laparotomy sponges and instruments were removed from the abdomen. The peritoneum was closed with a running stitch of 2-0 chromic, and the fascia was also closed in a running fashion as well as the 1cm incision made inferiorly on the fascia in the midline. The subcutaneous layer was reapproximated with 2-0 plain gut. The skin was closed with a 3-0 monocryl via a  subcuticular stitch. Cystoscopy was then  performed perroutine and steriley and bilateral ureters were noted to efflux without difficulty and no inadvertent  bladder injuries were noted.  Sponge, lap, needle, and instrument counts were correct times two. The patient was taken to the recovery area awake, extubated and in stable condition.

## 2012-03-02 NOTE — Progress Notes (Signed)
Day of Surgery Procedure(s) (LRB): HYSTERECTOMY ABDOMINAL (N/A) CYSTOSCOPY (N/A)  Subjective: Patient reports no problems except some pain.  No N/V.  Objective: I have reviewed patient's vital signs and intake and output.  General: alert Resp: clear to auscultation bilaterally Cardio: regular rate and rhythm GI: soft, app tender, ND, dec BS Extremities: SCDs are on, no calf tenderness Vaginal Bleeding: none  Assessment: s/p Procedure(s) (LRB): HYSTERECTOMY ABDOMINAL (N/A) CYSTOSCOPY (N/A): stable  Plan: routine post op care, good UOP  LOS: 0 days    Jazmina Muhlenkamp Y 03/02/2012, 5:47 PM

## 2012-03-02 NOTE — Anesthesia Preprocedure Evaluation (Signed)
Anesthesia Evaluation  Patient identified by MRN, date of birth, ID band Patient awake    Reviewed: Allergy & Precautions, H&P , Patient's Chart, lab work & pertinent test results, reviewed documented beta blocker date and time   History of Anesthesia Complications Negative for: history of anesthetic complications  Airway Mallampati: II TM Distance: >3 FB Neck ROM: full    Dental No notable dental hx.    Pulmonary neg pulmonary ROS,  breath sounds clear to auscultation  Pulmonary exam normal       Cardiovascular Exercise Tolerance: Good negative cardio ROS  Rhythm:regular Rate:Normal     Neuro/Psych negative neurological ROS  negative psych ROS   GI/Hepatic negative GI ROS, Neg liver ROS, GERD-  ,  Endo/Other  negative endocrine ROS  Renal/GU negative Renal ROS     Musculoskeletal   Abdominal   Peds  Hematology negative hematology ROS (+)   Anesthesia Other Findings Hx of colposcopy with cervical biopsy     H/O varicella        H/O measles     Yeast infection        Bacterial infection     Trichomonas        Chlamydial infection     H/O amenorrhea        BV (bacterial vaginosis) 12/2005   Fibroid 01/10/06      Menometrorrhagia 09/23/06   Irregular menses        H/O dysmenorrhea 09/28/07   Dysplasia of cervix, low grade (CIN 1) 05/31/11      Abnormal Pap smear 2004 colpo.[facility] GERD (gastroesophageal reflux disease)   no medication    Urinary retention with incomplete bladder emptying    Reproductive/Obstetrics negative OB ROS                           Anesthesia Physical Anesthesia Plan  ASA: II  Anesthesia Plan: General ETT   Post-op Pain Management:    Induction:   Airway Management Planned:   Additional Equipment:   Intra-op Plan:   Post-operative Plan:   Informed Consent: I have reviewed the patients History and Physical, chart, labs and discussed the procedure including  the risks, benefits and alternatives for the proposed anesthesia with the patient or authorized representative who has indicated his/her understanding and acceptance.   Dental Advisory Given  Plan Discussed with: CRNA and Surgeon  Anesthesia Plan Comments:         Anesthesia Quick Evaluation

## 2012-03-03 ENCOUNTER — Encounter (HOSPITAL_COMMUNITY): Payer: Self-pay | Admitting: Obstetrics and Gynecology

## 2012-03-03 LAB — CBC
HCT: 27.6 % — ABNORMAL LOW (ref 36.0–46.0)
MCH: 25.4 pg — ABNORMAL LOW (ref 26.0–34.0)
MCHC: 32.6 g/dL (ref 30.0–36.0)
RDW: 19.8 % — ABNORMAL HIGH (ref 11.5–15.5)

## 2012-03-03 NOTE — Progress Notes (Signed)
UR Chart review completed.  

## 2012-03-03 NOTE — Progress Notes (Addendum)
Casey Lloyd is a11 y.o.  [id number]  Post Op Date # 1  Abdominal Hysterectomy   Subjective: Patient is Doing well postoperatively. Patient has tolerated liquids without nausea, ambulated in the halls without dizziness but has not passed flatus.  Still has Foley but has no complaints of pain or discomfort.  Objective: Vital signs in last 24 hours: Temp:  [97.8 F (36.6 C)-98.8 F (37.1 C)] 98.5 F (36.9 C) (08/02 0543) Pulse Rate:  [69-96] 94  (08/02 0543) Resp:  [14-20] 18  (08/02 0613) BP: (92-145)/(56-84) 92/56 mmHg (08/02 0543) SpO2:  [97 %-100 %] 100 % (08/02 4098) Weight:  [148 lb (67.132 kg)] 148 lb (67.132 kg) (08/01 1755)  Intake/Output from previous day: 08/01 0701 - 08/02 0700 In: 5925 [I.V.:4525] Out: 3500 [Urine:1700] Intake/Output this shift:    Lab 03/03/12 0530 03/02/12 1516 02/25/12 0839  WBC 12.0* 15.6* 4.3  HGB 9.0* 12.8 8.1*  HCT 27.6* 40.0 27.7*  PLT 104* 133* 234    No results found for this basename: NA:3,K:3,CL:3,CO2:3,BUN:3,CREATININE:3,CALCIUM:3,LABALBU:3,PROT:3,BILITOT:3,ALKPHOS:3,ALT:3,AST:3,GLUCOSE:3 in the last 168 hours  EXAM: General: alert, cooperative and no distress Resp: clear to auscultation bilaterally Cardio: regular rate and rhythm, S1, S2 normal, no murmur, click, rub or gallop GI: soft, good bowel sounds, dressing is clean, dry and intact. Extremities: SCD hose in place, no calf tenderness and negative Homan's sign Foley has 300 of clear, pale yellow urine.   Assessment: s/p Procedure(s): HYSTERECTOMY ABDOMINAL CYSTOSCOPY: stable, progressing well, anemia and no complaints  Plan: Advance diet Encourage ambulation Advance to PO medication Routine care  LOS: 1 day    POWELL,ELMIRA, PA-C 03/03/2012 7:20 AM  Agree with above - AYR

## 2012-03-04 MED ORDER — IBUPROFEN 600 MG PO TABS: 600.0000 mg | ORAL_TABLET | Freq: Four times a day (QID) | ORAL | Status: AC | PRN

## 2012-03-04 MED ORDER — OXYCODONE-ACETAMINOPHEN 5-325 MG PO TABS: 1.0000 | ORAL_TABLET | Freq: Four times a day (QID) | ORAL | Status: AC | PRN

## 2012-03-04 NOTE — Discharge Summary (Signed)
Physician Discharge Summary  Patient ID: Casey Lloyd MRN: [id number] DOB/AGE: 1976/05/03 36 y.o.  Admit date: 03/02/2012 Discharge date: 03/04/2012  Admission Diagnoses:Large Symptomatic Fibroids  Discharge Diagnoses:s/pSCH doing well.  No complaints. Questions answered.  Denies Flatus but tolerating po and no N/V. Active Problems:  * No active hospital problems. *    Discharged Condition: good  Hospital Course: routine post op course without event s/p [facility] and Cystoscopy.  Pt rece'd 4u PRBCs intraoperatively.  Consults: None  Significant Diagnostic Studies: n/a  Treatments: IV hydration, surgery: [facility] and post op care  Discharge Exam: Blood pressure 103/67, pulse 91, temperature 98.4 F (36.9 C), temperature source Oral, resp. rate 18, height 5\' 3"  (1.6 m), weight 148 lb (67.132 kg), SpO2 98.00%. General appearance: alert and no distress Resp: clear to auscultation bilaterally Cardio: regular rate and rhythm GI: soft, app tender, ND, NABS Extremities: Homans sign is negative, no sign of DVT and no edema, redness or tenderness in the calves or thighs Incision/Wound:c/d/i  Disposition: to home doing well  Discharge Orders    Future Appointments: Provider: Department: Dept Phone: Center:   03/30/2012 9:00 AM Purcell Nails, MD [facility] [phone] None     Medication List  As of 03/04/2012 12:26 PM   TAKE these medications         cyclobenzaprine 10 MG tablet   Commonly known as: FLEXERIL   Take 1 tablet (10 mg total) by mouth every 8 (eight) hours as needed for muscle spasms.      ergocalciferol 50000 UNITS capsule   Commonly known as: VITAMIN D2   Take 50,000 Units by mouth 2 (two) times a week. Mondays/Thursday      ferrous sulfate 325 (65 FE) MG tablet   Take 325 mg by mouth daily with breakfast.      ibuprofen 600 MG tablet   Commonly known as: ADVIL,MOTRIN   Take 1 tablet (600 mg total) by mouth every 6 (six) hours as needed (mild pain).      naproxen  sodium 220 MG tablet   Commonly known as: ANAPROX   Take 660 mg by mouth daily as needed. Pelvic pain      oxyCODONE-acetaminophen 5-325 MG per tablet   Commonly known as: PERCOCET/ROXICET   Take 1-2 tablets by mouth every 6 (six) hours as needed (moderate to severe pain (when tolerating fluids)).      silodosin 8 MG Caps capsule   Commonly known as: RAPAFLO   Take 8 mg by mouth daily with breakfast.           Discharge instructions discussed.  SignedPurcell Nails 03/04/2012, 12:26 PM

## 2012-03-04 NOTE — Progress Notes (Signed)
PT D/C HOME WITH FAMILY, WC, TO PRIVATE CAR. D/C INSTRUCTIONS AND PRESCRIPTIONS REVIEWED WITH PT. PT VERBALIZED UNDERSTANDING.

## 2012-03-06 LAB — TYPE AND SCREEN
Antibody Screen: NEGATIVE
Unit division: 0
Unit division: 0

## 2012-03-20 ENCOUNTER — Ambulatory Visit (INDEPENDENT_AMBULATORY_CARE_PROVIDER_SITE_OTHER): Payer: Commercial Managed Care - PPO | Admitting: Obstetrics and Gynecology

## 2012-03-20 ENCOUNTER — Encounter: Payer: Self-pay | Admitting: Obstetrics and Gynecology

## 2012-03-20 ENCOUNTER — Telehealth: Payer: Self-pay | Admitting: Obstetrics and Gynecology

## 2012-03-20 VITALS — Ht 64.0 in | Wt 142.0 lb

## 2012-03-20 DIAGNOSIS — N898 Other specified noninflammatory disorders of vagina: Secondary | ICD-10-CM

## 2012-03-20 DIAGNOSIS — N390 Urinary tract infection, site not specified: Secondary | ICD-10-CM

## 2012-03-20 DIAGNOSIS — B373 Candidiasis of vulva and vagina: Secondary | ICD-10-CM

## 2012-03-20 DIAGNOSIS — Z202 Contact with and (suspected) exposure to infections with a predominantly sexual mode of transmission: Secondary | ICD-10-CM

## 2012-03-20 DIAGNOSIS — Z9189 Other specified personal risk factors, not elsewhere classified: Secondary | ICD-10-CM

## 2012-03-20 LAB — POCT WET PREP (WET MOUNT)
Clue Cells Wet Prep Whiff POC: NEGATIVE
KOH Wet Prep POC: NEGATIVE
Trichomonas Wet Prep HPF POC: NEGATIVE
WBC, Wet Prep HPF POC: NEGATIVE
pH: 4.5

## 2012-03-20 LAB — POCT URINALYSIS DIPSTICK
Bilirubin, UA: NEGATIVE
Ketones, UA: NEGATIVE
Leukocytes, UA: NEGATIVE
Protein, UA: NEGATIVE
Spec Grav, UA: <=1.005
pH, UA: >=9

## 2012-03-20 MED ORDER — BUTOCONAZOLE NITRATE (1 DOSE) 2 % VA CREA: 1.0000 | TOPICAL_CREAM | Freq: Once | VAGINAL | Status: DC

## 2012-03-20 NOTE — Telephone Encounter (Signed)
AR pt 

## 2012-03-20 NOTE — Progress Notes (Signed)
Presents with c/o of thick white mucus 2 weeks postop no irritation abdomen nontender, soft Incision well healed Normal hair distrubition mons pubis,  EGBUS WNL, sterile speculum exam,  vagina pink, moist normal rugae,  cerix appears closed thick white discharge GC/CHL sent per pt requesat WET PREP neg clue, neg hyphae, neg trich A VVC P discussed gynezole RX. Baking soda bathes x20 minutes BID     F/O  As scheduled next week. Prn, annually Lavera Guise, CNM

## 2012-03-20 NOTE — Progress Notes (Signed)
Vaginal discharge: yellowthick mucoid Itching / Burning: no Fever: no  Symptoms have been present for 2 days. Has used over-the-counter treatment: no Associated symptoms:  Pelvic pain: no       Dyspareunia: no     Odor:  no  History of STD:  history of chlamydia STD screen:requested  Pt c/o burning when voiding.

## 2012-03-20 NOTE — Telephone Encounter (Signed)
Pt returned call. Pt was given ov today @ 4:15 with CNM MK. Mathis Bud

## 2012-03-20 NOTE — Telephone Encounter (Signed)
Left message for pt to return call. Pt called earlier regarding dysuria, vaginal discharge, following hysterectomy on last week. Pt desires ov. Casey Lloyd

## 2012-03-30 ENCOUNTER — Encounter: Payer: Self-pay | Admitting: Obstetrics and Gynecology

## 2012-03-30 ENCOUNTER — Ambulatory Visit (INDEPENDENT_AMBULATORY_CARE_PROVIDER_SITE_OTHER): Payer: Commercial Managed Care - PPO | Admitting: Obstetrics and Gynecology

## 2012-03-30 VITALS — BP 104/70 | Temp 98.6°F | Ht 64.0 in | Wt 141.0 lb

## 2012-03-30 DIAGNOSIS — N898 Other specified noninflammatory disorders of vagina: Secondary | ICD-10-CM

## 2012-03-30 DIAGNOSIS — N809 Endometriosis, unspecified: Secondary | ICD-10-CM

## 2012-03-30 DIAGNOSIS — Z90710 Acquired absence of both cervix and uterus: Secondary | ICD-10-CM

## 2012-03-30 LAB — POCT WET PREP (WET MOUNT): Clue Cells Wet Prep Whiff POC: NEGATIVE

## 2012-03-30 NOTE — Progress Notes (Signed)
DATE OF SURGERY: 03/02/2012 TYPE OF SURGERY:ABD Hysterectomy PAIN:No VAG BLEEDING: no VAG DISCHARGE: no NORMAL GI FUNCTN: yes NORMAL GU FUNCTN: yes  1. Uterus and cervix - BENIGN LEIOMYOMATA (UP TO 10.0 CM). - BENIGN SECRETORY PHASE ENDOMETRIUM. - BENIGN ENDOCERVICAL MUCOSA; NO ATYPIA OR MALIGNANCY PRESENT. - UTERINE SEROSAL ADHESIONS. - BENIGN ENDOMETRIOTIC IMPLANTS INVOLVING FALLOPIAN TUBES. 2. Pelvis, biopsy - BENIGN HEMORRHAGIC CORPUS LUTEUM.  Filed Vitals:   03/30/12 0904  BP: 104/70  Temp: 98.6 F (37 C)   ROS: noncontributory  ABD: well healed incision Pelvic exam:  VULVA: normal appearing vulva with no masses, tenderness or lesions,  VAGINA: normal appearing vagina with normal color and discharge, no lesions, CERVIX: normal appearing cervix without discharge or lesions,  UTERUS: uterus is normal size, shape, consistency and nontender,  ADNEXA: normal adnexa in size, nontender and no masses.   A/P S/[facility] - fibroids and endometriosis on path Benign corpus luteal cyst Note to return to work 9/3 [employer] in June of next yr Reviewed path results and recs and options discussed esp if pain returns

## 2012-03-30 NOTE — Addendum Note (Signed)
Addended by: Marla Roe A on: 03/30/2012 10:00 AM   Modules accepted: Orders

## 2012-07-21 ENCOUNTER — Telehealth: Payer: Self-pay

## 2012-07-21 NOTE — Telephone Encounter (Signed)
Called to RF pt 's Valtrex 500 mg  # 30 x 2 per protocol. Melody Comas A

## 2012-08-25 ENCOUNTER — Encounter: Payer: Self-pay | Admitting: Obstetrics and Gynecology

## 2014-06-03 ENCOUNTER — Encounter: Payer: Self-pay | Admitting: Obstetrics and Gynecology

## 2015-05-05 ENCOUNTER — Ambulatory Visit: Payer: Managed Care, Other (non HMO) | Attending: Gynecologic Oncology | Admitting: Gynecologic Oncology

## 2015-05-05 ENCOUNTER — Encounter: Payer: Self-pay | Admitting: Gynecologic Oncology

## 2015-05-05 VITALS — BP 127/79 | HR 87 | Temp 98.2°F | Ht 64.0 in | Wt 160.3 lb

## 2015-05-05 DIAGNOSIS — N83209 Unspecified ovarian cyst, unspecified side: Secondary | ICD-10-CM | POA: Diagnosis present

## 2015-05-05 DIAGNOSIS — N83202 Unspecified ovarian cyst, left side: Secondary | ICD-10-CM | POA: Insufficient documentation

## 2015-05-05 DIAGNOSIS — N83201 Unspecified ovarian cyst, right side: Secondary | ICD-10-CM | POA: Diagnosis not present

## 2015-05-05 NOTE — Patient Instructions (Signed)
If you decide that you would like to proceed with surgery to have your ovaries and tubes removed by Dr. Denman George please call our office.  If you decide to not proceed with surgery at this time, follow up with Dr. Mancel Bale office for an ultrasound in 6 months.  Please call our office either way with the decision or with any additional questions or concerns you have.

## 2015-05-05 NOTE — Progress Notes (Signed)
Consult Note: Gyn-Onc  Consult was requested by Dr. Everett Graff for the evaluation of ALEE GRESSMAN 39 y.o. female with bilateral ovarian cysts  CC:  Chief Complaint  Patient presents with  . Ovarian Cyst    New consult    Assessment/Plan:  Ms. CHISA KUSHNER  is a 39 y.o.  year old with endometriosis, pelvic adhesive disease and asymptomatic bilateral ovarian cysts.  I am overall reassured that these cysts are not malignant (based on the ultrasound description of a "simple" cyst and two small complex cysts with "no flow" that look like endometriomas). Her CA 125 is mildly elevated (41 U/mL) but this is a common finding in premenopausal women with benign cysts, particularly with a history of endometriosis. These cysts are persistent, but minimally changed over time (imaging over 2 years), and this natural history is also reassuring. Because they are asymptomatic and at a low probability for being malignant, there is no compelling reason to move towards surgery other than for patient concern. I discussed with the patient that these cysts will likely persist and not regress, but that malgnant transformation does not occur in benign cysts. It would be reasonable to continue 6 monthly ultrasound re-imaging to document stability over a 2 year period to confirm stability in size. Subtle increases in size would not be concerning, but elevations of >50% from prior size measurements would be more concerning and suggestive of ongoing growth.  I discussed with the patient that if she felt most comfortable with pursuing surgery (eg with robotic assisted laparoscopic BSO and lysis of adhesions), I would be happy to arrange this. I discussed that her surgery would be associated with increased risk due to her history of prior surgeries, and known adhesive disease. We reviewed surgical risks, particularly that of damage to internal organs, particularly bowel injury, and the potential need for reoperation. While  this risk is low, it is of high morbidity and significance. I also discussed that for her history of endometriosis I would recommend BSO (not ovarian cystectomies) at the time of surgery to prevent reformation of cysts and repetitive surgical interventions. I discussed that early surgical menopause would ensue and that this can be associated with earlier all-cause mortality, which may be mitigated some by the addition of estrogen replacement therapy. With her history of persistent cyclic bleeding post supracervical hyst, I would recommend cycling her with progestins in that setting as there may be residual endometrial glands which should not be exposed to unopposed estrogen.  The patient will consider her options further. She will inform me if she elects for surgery and we will schedule a robotic BSO, lysis of adhesions. If she elects for conservative management, I recommend she return to Dr Mancel Bale in 6 months for repeat US and repeated US's every 6 months for 2 years to document natural history of the cysts and stability.   HPI: Elleigh Cassetta is a 39 year old woman who is seen in consultation at the request of Dr. Everett Graff for bilateral ovarian cysts. The patient has a history of supracervical hysterectomy via laparotomy in August 2013. Significant pelvic adhesions with the tubes and ovaries were noted at that time and the structures were not removed. She carried a diagnosis of endometriosis, however her hysterectomy was performed because of symptomatic uterine fibroids. Postoperatively she developed persistent cyclical light vaginal spotting. She underwent her first postoperative ultrasound 2014 per patient which she states revealed a cyst on one of her ovaries. As part of follow-up for  this cyst the ultrasound was repeated in February 2015, and showed persistent and stable size of the cyst. Her bleeding was attributed to residual endometrial glands in the cervical stump and has remained stable since  that time. She denies pelvic pain or symptoms from his cysts. She denies abdominal bloating early satiety. She is no personal history or family history of breast or ovarian cancer. Her father had a diagnosis of colon cancer at age 63.  She is seen for her scheduled follow-up in September 2016 and underwent a repeat ultrasound scan of Dr. Mancel Bale office on 04/22/2015. This shows surgically absent uterus. The right ovary contained a simple appearing cystic mass measuring 4.8 x 2.7 x 2.8 cm and this was slightly increased in size from previous scan. No flow was seen to the area oncology Doppler. The left ovary contained 2 separate complex cystic areas one measuring 3.8 x 2 x 2.9 cm and the other merit measuring 3 x 1.7 x 2.8 cm. No flow was seen to these areas oncology Doppler. These were described as possible endometriomas. A CA-125 was drawn on 04/30/2015 and was mildly elevated at 41 units per milliliter.  The patient's past surgical history includes a cesarean section and a supracervical hysterectomy performed via a low transverse incision.  Current Meds:  Outpatient Encounter Prescriptions as of 05/05/2015  Medication Sig  . cyclobenzaprine (FLEXERIL) 10 MG tablet Take 10 mg by mouth every 4 (four) hours as needed for muscle spasms.  Marland Kitchen ibuprofen (ADVIL,MOTRIN) 600 MG tablet Take 600 mg by mouth every 6 (six) hours as needed.  . valACYclovir (VALTREX) 500 MG tablet Take 500 mg by mouth daily.  . [DISCONTINUED] Butoconazole Nitrate, 1 Dose, (GYNAZOLE-1) 2 % CREA Place 1 applicator vaginally once.  . [DISCONTINUED] ergocalciferol (VITAMIN D2) 50000 UNITS capsule Take 50,000 Units by mouth 2 (two) times a week. Mondays/Thursday  . [DISCONTINUED] ferrous sulfate 325 (65 FE) MG tablet Take 325 mg by mouth daily with breakfast.  . [DISCONTINUED] HYDROCODONE-ACETAMINOPHEN PO Take by mouth.  . [DISCONTINUED] naproxen sodium (ANAPROX) 220 MG tablet Take 660 mg by mouth daily as needed. Pelvic pain  .  [DISCONTINUED] silodosin (RAPAFLO) 8 MG CAPS capsule Take 8 mg by mouth daily with breakfast.  . [DISCONTINUED] tinidazole (TINDAMAX) 500 MG tablet TAKE 4 TABLET(S) EVERY DAY BY ORAL ROUTE FOR 2 DAYS.   No facility-administered encounter medications on file as of 05/05/2015.    Allergy: No Known Allergies  Social Hx:   Social History   Social History  . Marital Status: Single    Spouse Name: N/A  . Number of Children: N/A  . Years of Education: N/A   Occupational History  . Not on file.   Social History Main Topics  . Smoking status: Never Smoker   . Smokeless tobacco: Never Used  . Alcohol Use: No  . Drug Use: No  . Sexual Activity: Yes    Birth Control/ Protection: Condom   Other Topics Concern  . Not on file   Social History Narrative    Past Surgical Hx:  Past Surgical History  Procedure Laterality Date  . Cervical biopsy  w/ loop electrode excision    . Cesarean section    . Dilation and curettage of uterus    . Abdominal hysterectomy  03/02/2012    Procedure: HYSTERECTOMY ABDOMINAL;  Surgeon: Delice Lesch, MD;  Location: [facility];  Service: Gynecology;  Laterality: N/A;  . Cystoscopy  03/02/2012    Procedure: CYSTOSCOPY;  Surgeon: Harvie Bridge  Mancel Bale, MD;  Location: [facility];  Service: Gynecology;  Laterality: N/A;    Past Medical Hx:  Past Medical History  Diagnosis Date  . Hx of colposcopy with cervical biopsy   . H/O varicella   . H/O measles   . Yeast infection   . Bacterial infection   . Trichomonas   . Chlamydial infection   . H/O amenorrhea   . BV (bacterial vaginosis) 12/2005  . Fibroid 01/10/06  . Menometrorrhagia 09/23/06  . Irregular menses   . H/O dysmenorrhea 09/28/07  . Dysplasia of cervix, low grade (CIN 1) 05/31/11  . Abnormal Pap smear 2004    colpo.[facility]  . GERD (gastroesophageal reflux disease)     no medication  . Urinary retention with incomplete bladder emptying     Past Gynecological History:  No history of abnormap paps. S/p  supracervical hyst.  No LMP recorded.  Family Hx:  Family History  Problem Relation Age of Onset  . Hypertension Maternal Aunt   . Hypertension Maternal Grandmother   . Hypertension Maternal Grandfather   . Diabetes Maternal Grandfather   . Hypertension Mother   . Colon cancer Father     Review of Systems:  Constitutional  Feels well,    ENT Normal appearing ears and nares bilaterally Skin/Breast  No rash, sores, jaundice, itching, dryness Cardiovascular  No chest pain, shortness of breath, or edema  Pulmonary  No cough or wheeze.  Gastro Intestinal  No nausea, vomitting, or diarrhoea. No bright red blood per rectum, no abdominal pain, change in bowel movement, or constipation.  Genito Urinary  No frequency, urgency, dysuria,  Musculo Skeletal  No myalgia, arthralgia, joint swelling or pain  Neurologic  No weakness, numbness, change in gait,  Psychology  No depression, anxiety, insomnia.   Vitals:  Blood pressure 127/79, pulse 87, temperature 98.2 F (36.8 C), temperature source Oral, height 5\' 4"  (1.626 m), weight 160 lb 4.8 oz (72.712 kg), SpO2 100 %.  Physical Exam: WD in NAD Neck  Supple NROM, without any enlargements.  Lymph Node Survey No cervical supraclavicular or inguinal adenopathy Cardiovascular  Pulse normal rate, regularity and rhythm. S1 and S2 normal.  Lungs  Clear to auscultation bilateraly, without wheezes/crackles/rhonchi. Good air movement.  Skin  No rash/lesions/breakdown  Psychiatry  Alert and oriented to person, place, and time  Abdomen  Normoactive bowel sounds, abdomen soft, non-tender and thin without evidence of hernia.  Back No CVA tenderness Genito Urinary  Vulva/vagina: Normal external female genitalia.  No lesions. No discharge or bleeding.  Bladder/urethra:  No lesions or masses, well supported bladder  Vagina: normal  Cervix: Normal appearing, no lesions.  Uterus: surgically absent   Adnexa: unable to appreciate pelvic  masses. Rectal  Good tone, no masses no cul de sac nodularity.  Extremities  No bilateral cyanosis, clubbing or edema.   Donaciano Eva, MD  05/05/2015, 11:40 AM

## 2017-03-16 ENCOUNTER — Telehealth: Payer: Self-pay | Admitting: Gynecologic Oncology

## 2017-03-16 ENCOUNTER — Encounter: Payer: Self-pay | Admitting: Gynecologic Oncology

## 2017-03-16 NOTE — Telephone Encounter (Signed)
Appt has been scheduled for the pt to see Dr. Alycia Rossetti on 8/22 at 145pm. Pt aware to arrive 15 minutes early. Letter mailed to the pt and faxed to the referring.

## 2017-03-18 ENCOUNTER — Telehealth: Payer: Self-pay | Admitting: *Deleted

## 2017-03-18 NOTE — Telephone Encounter (Signed)
Open by mistake

## 2017-03-22 NOTE — Progress Notes (Signed)
Consult Note: Gyn-Onc  Consult was requested by Dr. Everett Graff for the evaluation of Casey Lloyd 41 y.o. female with bilateral ovarian cysts  CC:  Chief Complaint  Patient presents with  . Elevated cancer antigen 125 (CA 125)    Assessment/Plan:  Casey Lloyd  is a 41 y.o.  year old with endometriosis, pelvic adhesive disease and asymptomatic bilateral ovarian cysts. She was last seen by Dr. Denman George in 2016 and was to have every 6 month visits or return if she wished to proceed with definitive surgery. She comes in today for follow-up. She remains asymptomatic and what is felt to be an endometrioma on her right ovary has only slightly increased in size over the past 2 years. Her CA-125 did go up however is hard to know as we have 2 points on the curve. Being that we discussed included repeating a CA-125 in about 2 months to see if we could have a third point on the curved to see which way it is going, we could proceed with definitive surgery, or we could try suppression of this endometrioma.  In reviewing Dr. Mancel Bale notes, the exact same things that were discussed with her. They discussed starting orilissa and then repeating an ultrasound. I think that's perfectly thickly reasonable and can also check a repeat CA-125. We're available to Dr. Mancel Bale if she gets these results and wishes to discuss them with [place] by phone. We're available to the patient if she has any questions. If the patient wishes to proceed with definitive surgery were happy to be of assistance if Dr. Mancel Bale wishes [place] to be involved.  We appreciate the opportunity to partner in the care of this very pleasant patient.   HPI: Casey Lloyd is a 41 year old woman who is seen in consultation at the request of Dr. Everett Graff for bilateral ovarian cysts. The patient has a history of supracervical hysterectomy via laparotomy in August 2013. Significant pelvic adhesions with the tubes and ovaries were noted at that time and  the structures were not removed. She carried a diagnosis of endometriosis, however her hysterectomy was performed because of symptomatic uterine fibroids. Postoperatively she developed persistent cyclical light vaginal spotting. She underwent her first postoperative ultrasound 2014 per patient which she states revealed a cyst on one of her ovaries. As part of follow-up for this cyst the ultrasound was repeated in February 2015, and showed persistent and stable size of the cyst. Her bleeding was attributed to residual endometrial glands in the cervical stump and has remained stable since that time. She is no personal history or family history of breast or ovarian cancer. Her father had a diagnosis of colon cancer at age 82.  Ultrasound in Dr. Mancel Bale office on 04/22/2015. This shows surgically absent uterus. The right ovary contained a simple appearing cystic mass measuring 4.8 x 2.7 x 2.8 cm and this was slightly increased in size from previous scan. No flow was seen to the area oncology Doppler. The left ovary contained 2 separate complex cystic areas one measuring 3.8 x 2 x 2.9 cm and the other merit measuring 3 x 1.7 x 2.8 cm. No flow was seen to these areas oncology Doppler. These were described as possible endometriomas. A CA-125 was drawn on 04/30/2015 and was mildly elevated at 41 units per milliliter.  She was recently seen in Dr. Mancel Bale office and had an ultrasound 03/10/2017. The cervix appeared normal. The right ovary contained a similar appearing nonvascular mass that appeared  cystic with low level echoes throughout. Was felt to likely be an endometrioma measuring 3.9 x 2.8 x 3.5 cm. Previously was 3.1 x 3 x 3 cm. The left ovary contained a simple appearing cyst measuring 3.1 x 3.2 x 3.6 cm. The previously described masses on either ultrasounds have resolved. There is no free fluid. The performed another CA-125 that was 93. The patient comes in for follow-up.  The patient's past surgical history  includes a cesarean section and a supracervical hysterectomy performed via a low transverse incision.  She states she occasionally still has some cramping but no longer has the monthly bleeding and spotting that she had any initially after her supracervical hysterectomy. She did have some left-sided ovarian pain but she felt that that was most consistent with ovulatory-type pain. She denies any change in her bladder habits. She does not have on the most regular bowel habits. She usually goes about every 3 days but does occasionally take marrow laxative. If she drinks coffee she is regular with coffee. She states that she's been doing some reading and she saw that the CA-125 could be elevated in the setting of endometriosis which is accurate.  Current Meds:  Outpatient Encounter Prescriptions as of 03/23/2017  Medication Sig  . [DISCONTINUED] cyclobenzaprine (FLEXERIL) 10 MG tablet Take 10 mg by mouth every 4 (four) hours as needed for muscle spasms.  . [DISCONTINUED] ibuprofen (ADVIL,MOTRIN) 600 MG tablet Take 600 mg by mouth every 6 (six) hours as needed.  . [DISCONTINUED] valACYclovir (VALTREX) 500 MG tablet Take 500 mg by mouth daily.   No facility-administered encounter medications on file as of 03/23/2017.     Allergy: No Known Allergies  Social Hx:   Social History   Social History  . Marital status: Single    Spouse name: N/A  . Number of children: N/A  . Years of education: N/A   Occupational History  . Not on file.   Social History Main Topics  . Smoking status: Never Smoker  . Smokeless tobacco: Never Used  . Alcohol use No  . Drug use: No  . Sexual activity: Yes    Birth control/ protection: Condom   Other Topics Concern  . Not on file   Social History Narrative  . No narrative on file    Past Surgical Hx:  Past Surgical History:  Procedure Laterality Date  . ABDOMINAL HYSTERECTOMY  03/02/2012   Procedure: HYSTERECTOMY ABDOMINAL;  Surgeon: Delice Lesch, MD;   Location: [facility];  Service: Gynecology;  Laterality: N/A;  . CERVICAL BIOPSY  W/ LOOP ELECTRODE EXCISION    . CESAREAN SECTION    . CYSTOSCOPY  03/02/2012   Procedure: CYSTOSCOPY;  Surgeon: Delice Lesch, MD;  Location: [facility];  Service: Gynecology;  Laterality: N/A;  . DILATION AND CURETTAGE OF UTERUS      Past Medical Hx:  Past Medical History:  Diagnosis Date  . Abnormal Pap smear 2004   [email or web address]  . Bacterial infection   . BV (bacterial vaginosis) 12/2005  . Chlamydial infection   . Dysplasia of cervix, low grade (CIN 1) 05/31/11  . Fibroid 01/10/06  . GERD (gastroesophageal reflux disease)    no medication  . H/O amenorrhea   . H/O dysmenorrhea 09/28/07  . H/O measles   . H/O varicella   . Hx of colposcopy with cervical biopsy   . Irregular menses   . Menometrorrhagia 09/23/06  . Trichomonas   . Urinary retention with incomplete bladder emptying   .  Yeast infection     Past Gynecological History:  No history of abnormap paps. S/p supracervical hyst.  No LMP recorded.  Family Hx:  Family History  Problem Relation Age of Onset  . Colon cancer Father   . Hypertension Maternal Aunt   . Hypertension Maternal Grandmother   . Hypertension Maternal Grandfather   . Diabetes Maternal Grandfather   . Hypertension Mother     No depression, anxiety, insomnia.   Vitals:  Blood pressure 127/74, pulse 80, temperature 98.6 F (37 C), temperature source Oral, resp. rate 20, weight 163 lb (73.9 kg), SpO2 100 %.  Physical Exam: WD in NAD  Abdomen: Normoactive bowel sounds, abdomen soft, non-tender and thin without evidence of hernia. Transverse skin incision. Well-healed supraumbilical incision.  Pelvic: External genitalia within normal limits. Bimanual examination reveals a cervix that is palpably normal. There is a fullness towards her right side but no distinct mass is appreciated. There are no and left adnexal masses. There is no nodularity in the  cul-de-sac.   Larrisa Cravey A., MD  03/23/2017, 2:24 PM

## 2017-03-23 ENCOUNTER — Encounter: Payer: Self-pay | Admitting: Gynecologic Oncology

## 2017-03-23 ENCOUNTER — Ambulatory Visit: Payer: Managed Care, Other (non HMO) | Attending: Gynecologic Oncology | Admitting: Gynecologic Oncology

## 2017-03-23 VITALS — BP 127/74 | HR 80 | Temp 98.6°F | Resp 20 | Wt 163.0 lb

## 2017-03-23 DIAGNOSIS — K219 Gastro-esophageal reflux disease without esophagitis: Secondary | ICD-10-CM | POA: Insufficient documentation

## 2017-03-23 DIAGNOSIS — Z90710 Acquired absence of both cervix and uterus: Secondary | ICD-10-CM | POA: Diagnosis not present

## 2017-03-23 DIAGNOSIS — N83201 Unspecified ovarian cyst, right side: Secondary | ICD-10-CM | POA: Diagnosis not present

## 2017-03-23 DIAGNOSIS — N83202 Unspecified ovarian cyst, left side: Secondary | ICD-10-CM

## 2017-03-23 DIAGNOSIS — N736 Female pelvic peritoneal adhesions (postinfective): Secondary | ICD-10-CM | POA: Insufficient documentation

## 2017-03-23 DIAGNOSIS — N809 Endometriosis, unspecified: Secondary | ICD-10-CM | POA: Insufficient documentation

## 2017-03-23 DIAGNOSIS — Z800 Family history of malignant neoplasm of digestive organs: Secondary | ICD-10-CM | POA: Diagnosis not present

## 2017-03-23 DIAGNOSIS — R971 Elevated cancer antigen 125 [CA 125]: Secondary | ICD-10-CM

## 2017-03-23 NOTE — Patient Instructions (Signed)
Plan to follow up with Dr. Mancel Bale.  Please give [place] a call for any questions or concerns.

## 2019-08-02 ENCOUNTER — Ambulatory Visit: Payer: Managed Care, Other (non HMO) | Attending: Internal Medicine

## 2019-08-02 DIAGNOSIS — Z20822 Contact with and (suspected) exposure to covid-19: Secondary | ICD-10-CM

## 2019-08-04 LAB — NOVEL CORONAVIRUS, NAA: SARS-CoV-2, NAA: NOT DETECTED

## 2021-04-30 ENCOUNTER — Other Ambulatory Visit: Payer: Self-pay | Admitting: Obstetrics and Gynecology

## 2021-04-30 DIAGNOSIS — N80109 Endometriosis of ovary, unspecified side, unspecified depth: Secondary | ICD-10-CM

## 2021-04-30 DIAGNOSIS — N801 Endometriosis of ovary: Secondary | ICD-10-CM

## 2021-05-10 ENCOUNTER — Other Ambulatory Visit: Payer: Managed Care, Other (non HMO)

## 2021-05-22 ENCOUNTER — Other Ambulatory Visit: Payer: Managed Care, Other (non HMO)

## 2021-06-04 ENCOUNTER — Other Ambulatory Visit: Payer: Self-pay

## 2021-06-04 ENCOUNTER — Ambulatory Visit
Admission: RE | Admit: 2021-06-04 | Discharge: 2021-06-04 | Disposition: A | Discharge status: Home / Self Care | Payer: Managed Care, Other (non HMO) | Source: Ambulatory Visit | Attending: Obstetrics and Gynecology | Admitting: Obstetrics and Gynecology

## 2021-06-04 DIAGNOSIS — N80109 Endometriosis of ovary, unspecified side, unspecified depth: Secondary | ICD-10-CM

## 2021-06-04 IMAGING — MR MR PELVIS WO/W CM
17 series · 48 of 48 positions shown · IV contrast (15ml Multihance)
Comparison: None available

CLINICAL DATA: History of endometriosis in a 45-year-old female.

EXAM:
MRI PELVIS WITHOUT AND WITH CONTRAST
TECHNIQUE: Multiplanar multisequence MR imaging of the pelvis was performed
both before and after administration of intravenous contrast.
CONTRAST:  15mL MULTIHANCE GADOBENATE DIMEGLUMINE 529 MG/ML IV SOLN

[Series 3: cor haste · coronal · 5.0mm · 0.78mm/px · 1 of 24 slices shown]
[im 1/24]
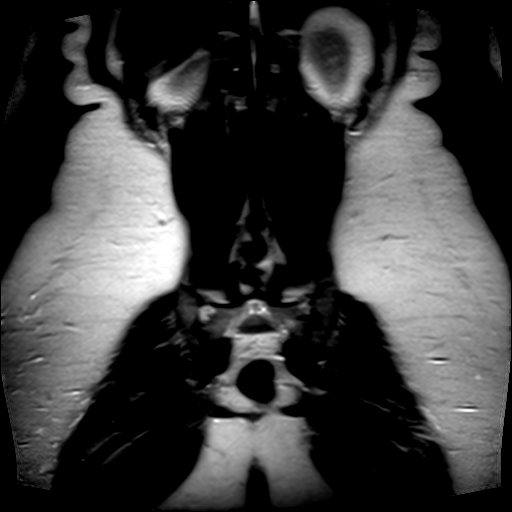

[Series 4: T2 · sagittal · 4.0mm · 0.94mm/px · 1 of 30 slices shown]
[im 1/30]
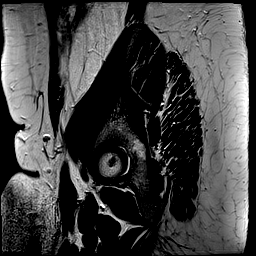

[Series 5: T2 fat-sat · axial · 4.0mm · 0.94mm/px · 1 of 40 slices shown]
[im 1/40]
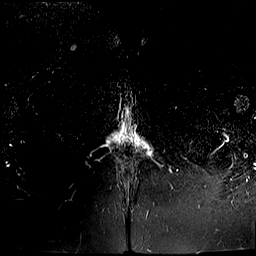

[Series 6: T1 · axial · 5.0mm · 0.55mm/px · z∈[-204,+27]mm · 2 of 86 slices shown]
[im 1/86]
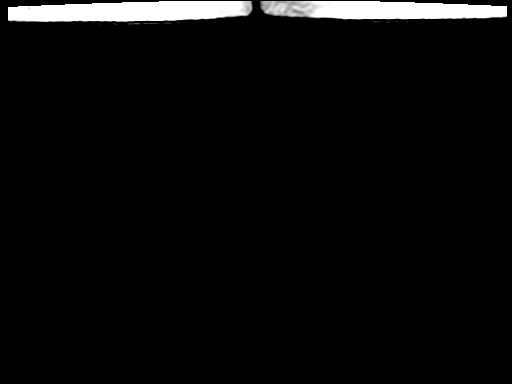
[im 86/86]
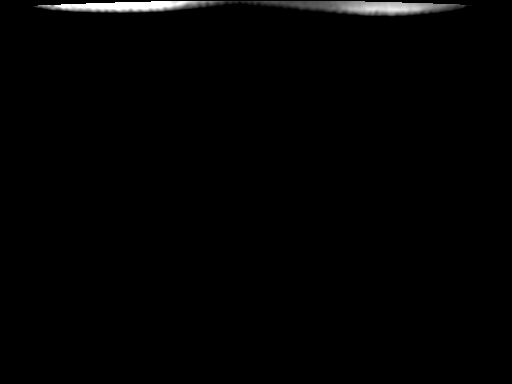

[Series 7: DWI · axial · 6.0mm · 1.82mm/px · z∈[-220,+3]mm · 3 of 96 slices shown]
[im 1/96]
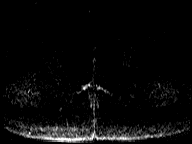
[im 48/96]
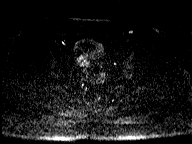
[im 96/96]
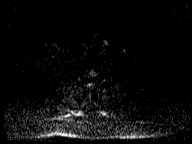

[Series 8: axial dwi_adc · axial · 6.0mm · 1.82mm/px · 1 of 32 slices shown]
[im 1/32]
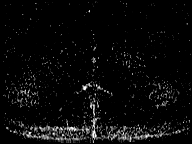

[Series 9: T1 dynamic · axial · non-contrast · 2.3mm · 1.45mm/px · z∈[-207,-7]mm · 4 of 88 slices shown]
[im 1/88]
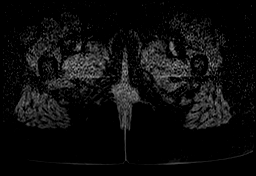
[im 30/88]
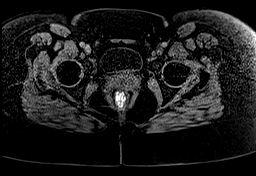
[im 59/88]
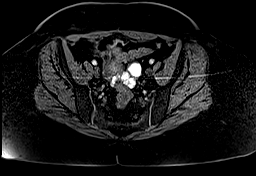
[im 88/88]
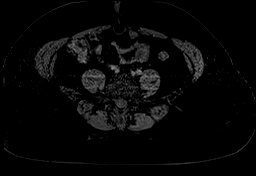

[Series 10: ax post dynamic · axial · 2.3mm · 1.45mm/px · z∈[-207,-7]mm · 4 of 88 slices shown (1 of 2)]
[im 1/88]
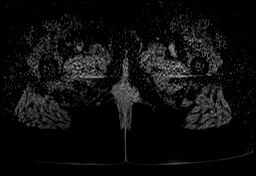
[im 30/88]
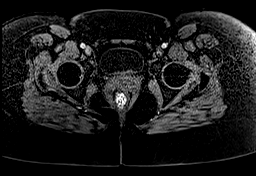
[im 59/88]
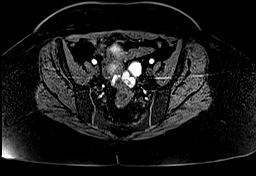
[im 88/88]
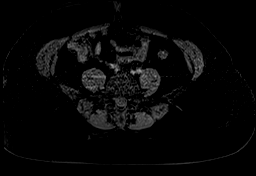

[Series 11: ax post dynamic · axial · 2.3mm · 1.45mm/px · z∈[-207,-7]mm · 4 of 88 slices shown (2 of 2)]
[im 1/88]
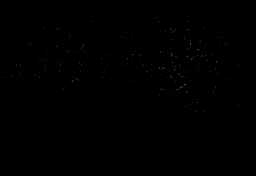
[im 30/88]
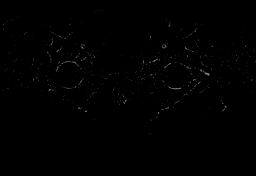
[im 59/88]
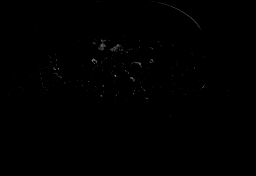
[im 88/88]
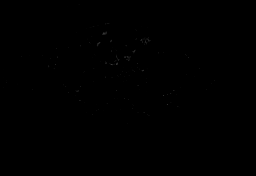

[Series 12: ax post 45 · axial · 2.3mm · 1.45mm/px · z∈[-207,-7]mm · 4 of 88 slices shown (1 of 2)]
[im 1/88]
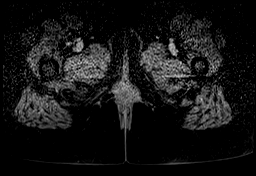
[im 30/88]
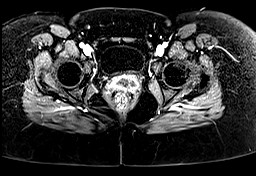
[im 59/88]
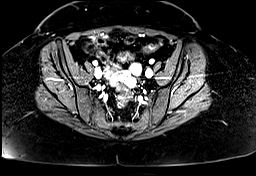
[im 88/88]
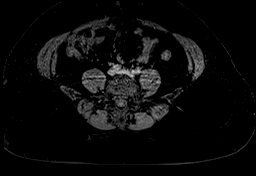

[Series 13: ax post 45 · axial · 2.3mm · 1.45mm/px · z∈[-207,-7]mm · 4 of 88 slices shown (2 of 2)]
[im 1/88]
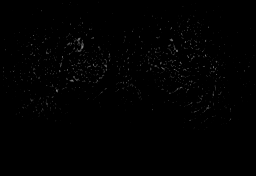
[im 30/88]
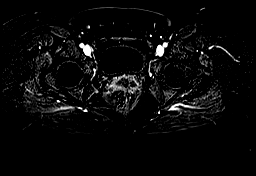
[im 59/88]
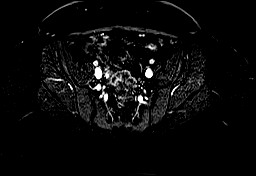
[im 88/88]
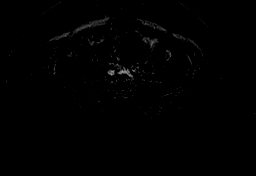

[Series 14: ax post 90 · axial · 2.3mm · 1.45mm/px · z∈[-207,-7]mm · 4 of 88 slices shown (1 of 2)]
[im 1/88]
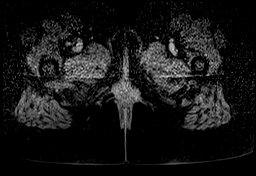
[im 30/88]
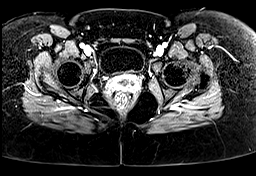
[im 59/88]
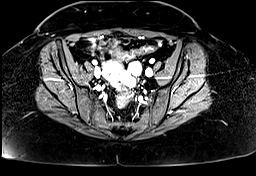
[im 88/88]
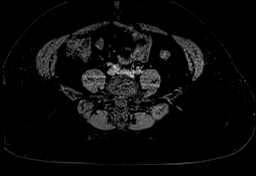

[Series 15: ax post 90 · axial · 2.3mm · 1.45mm/px · z∈[-207,-7]mm · 4 of 88 slices shown (2 of 2)]
[im 1/88]
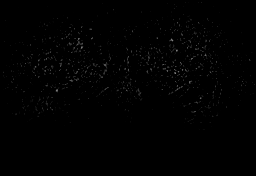
[im 30/88]
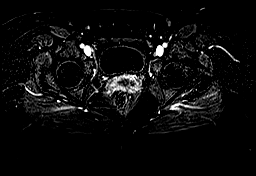
[im 59/88]
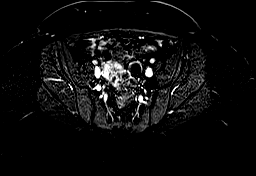
[im 88/88]
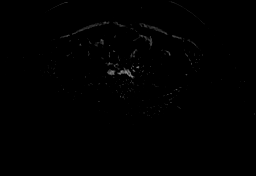

[Series 16: T1 dynamic post-contrast · sagittal · 2.5mm · 0.68mm/px · 2 of 56 slices shown]
[im 1/56]
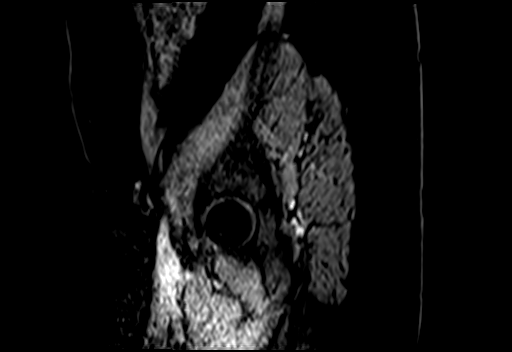
[im 56/56]
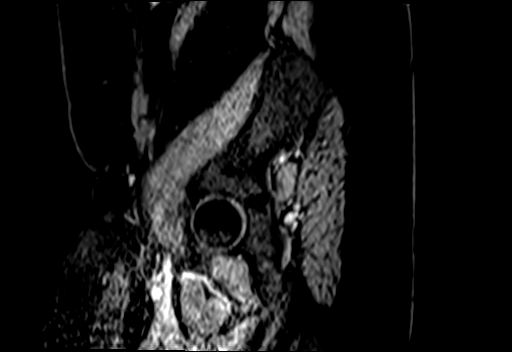

[Series 17: ax post 3+ · axial · 2.3mm · 1.45mm/px · z∈[-207,-7]mm · 4 of 88 slices shown (1 of 2)]
[im 1/88]
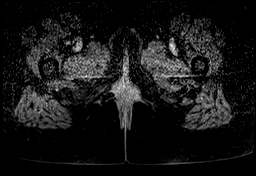
[im 30/88]
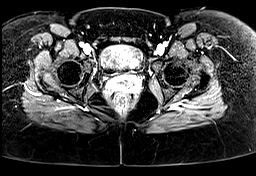
[im 59/88]
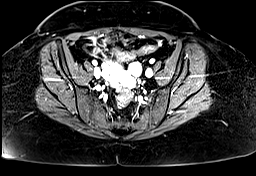
[im 88/88]
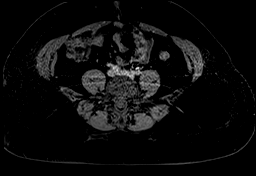

[Series 18: ax post 3+ · axial · 2.3mm · 1.45mm/px · z∈[-207,-7]mm · 4 of 88 slices shown (2 of 2)]
[im 1/88]
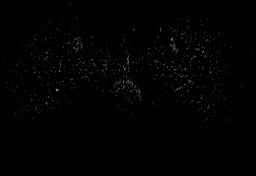
[im 30/88]
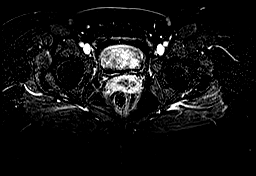
[im 59/88]
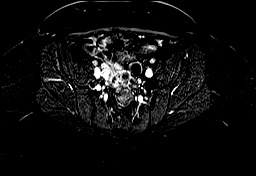
[im 88/88]
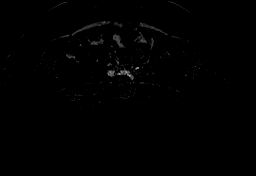

[Series 19: t2_tse_sag · sagittal · 5.0mm · 1.05mm/px · 1 of 25 slices shown]
[im 1/25]
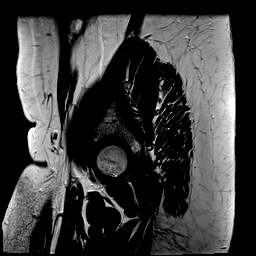

[48 of 48 positions shown; findings below may reference images not displayed]

FINDINGS: Urinary Tract: Urinary bladder is smooth with respect to contour and
is under distended limiting evaluation. There is no distal ureteral
distension

Bowel: Along the ventral surface of the upper rectum and sigmoid is
a complex appearing cystic area that tethers the adjacent
rectosigmoid, cervix and small-bowel loop. The small bowel loop is
to the upper RIGHT lateral aspect of the cystic and irregular
appearing pelvic mass. See below.

Vascular/Lymphatic: No signs of adenopathy in the pelvis. Patent
abdominal vessels with normal caliber.

Reproductive:  Post hysterectomy.

Cystic and solid area, mainly cystic measuring 8.2 x 5.9 x 8.1 cm.

More "solid" appearing area along the ventral surface of the colon
is contiguous with the cervix (image [DATE]) 19 x 15 mm.

Dominant cystic area to the RIGHT of midline shows "shading" on T2
weighted images and is markedly hyperintense on T1 weighted images
with numerous other smaller cystic areas displaying similar
features. The dominant area measuring 6.7 x 4.3 cm.

Ovarian vessels can be followed into this process on the LEFT and
RIGHT.

Some areas of restricted diffusion within cystic foci and no signs
of internal enhancement with respect to cystic foci. The more
"solid-appearing" tissue along the ventral margin of the colon shows
enhancement measuring 13 x 14 mm (image 39/13). This shows
intermediate T2 signal and intermediate T1 signal.

Scattered smaller foci of increased T1 signal are seen in the deep
pelvis tracking along the posterior margin of the cervix and ventral
surface of the high rectum. No sign of bowel obstruction.

Other:  Trace ascites.

Musculoskeletal: No suspicious bone lesions identified.
IMPRESSION: Signs of pelvic endometriosis with cystic endometriomas and deep
pelvic infiltrating disease which tethers cervix, colon, ovaries and
adjacent small bowel.

Area of marked hyperenhancement much more so than adjacent small
bowel with nodular area along the ventral surface of the rectum. No
gross evidence of restricted diffusion in this area; however,
enhancement more so than would be expected for simple deep
infiltrating disease, raising the question of malignant
transformation. Correlation with tumor markers or PET scan may be
helpful. Given colonic invasion could also consider the possibility
of endoscopic biopsy as warranted.

Cystic endometriomas elsewhere associated with the above process
without internal nodular enhancement.

Comparison with prior imaging may also be helpful if available.

Trace ascites.

No sign of bowel obstruction to the extent evaluated.

## 2021-06-04 MED ORDER — GADOBENATE DIMEGLUMINE 529 MG/ML IV SOLN
15.0000 mL | Freq: Once | INTRAVENOUS | Status: AC | PRN
  Administered 2021-06-04: 15 mL via INTRAVENOUS

## 2021-06-17 ENCOUNTER — Telehealth: Payer: Self-pay | Admitting: *Deleted

## 2021-06-17 NOTE — Telephone Encounter (Signed)
Patient called back and scheduled a new patient appt for 11/30 at 9 am with Dr Berline Lopes. Patient given the address and phone number for the clinic; along with the policy for mask and visitors

## 2021-06-17 NOTE — Telephone Encounter (Signed)
Called and left the patient a message to call the office back. Patient needs to be scheduled for a new patient appt  °

## 2021-06-24 ENCOUNTER — Encounter: Payer: Self-pay | Admitting: Gynecologic Oncology

## 2021-07-01 ENCOUNTER — Encounter: Payer: Self-pay | Admitting: Gynecologic Oncology

## 2021-07-01 ENCOUNTER — Inpatient Hospital Stay: Payer: Managed Care, Other (non HMO) | Attending: Gynecologic Oncology | Admitting: Gynecologic Oncology

## 2021-07-01 ENCOUNTER — Other Ambulatory Visit: Payer: Self-pay

## 2021-07-01 VITALS — BP 153/89 | HR 118 | Temp 99.0°F | Resp 18 | Ht 64.0 in | Wt 178.2 lb

## 2021-07-01 DIAGNOSIS — F419 Anxiety disorder, unspecified: Secondary | ICD-10-CM | POA: Diagnosis not present

## 2021-07-01 DIAGNOSIS — R140 Abdominal distension (gaseous): Secondary | ICD-10-CM | POA: Insufficient documentation

## 2021-07-01 DIAGNOSIS — K219 Gastro-esophageal reflux disease without esophagitis: Secondary | ICD-10-CM | POA: Diagnosis not present

## 2021-07-01 DIAGNOSIS — N80102 Endometriosis of left ovary, unspecified depth: Secondary | ICD-10-CM | POA: Insufficient documentation

## 2021-07-01 DIAGNOSIS — N80101 Endometriosis of right ovary, unspecified depth: Secondary | ICD-10-CM

## 2021-07-01 DIAGNOSIS — R6881 Early satiety: Secondary | ICD-10-CM | POA: Insufficient documentation

## 2021-07-01 DIAGNOSIS — Z90710 Acquired absence of both cervix and uterus: Secondary | ICD-10-CM | POA: Diagnosis not present

## 2021-07-01 DIAGNOSIS — N80552 Deep endometriosis of other parts of the colon: Secondary | ICD-10-CM | POA: Diagnosis not present

## 2021-07-01 DIAGNOSIS — Z87410 Personal history of cervical dysplasia: Secondary | ICD-10-CM | POA: Diagnosis not present

## 2021-07-01 DIAGNOSIS — N80202 Endometriosis of left fallopian tube, unspecified depth: Secondary | ICD-10-CM

## 2021-07-01 DIAGNOSIS — N80109 Endometriosis of ovary, unspecified side, unspecified depth: Secondary | ICD-10-CM

## 2021-07-01 NOTE — Patient Instructions (Signed)
It was a pleasure meeting you today.  We will work on getting a referral sent for you to see one of my partners at [facility] for surgical planning.  We will also reach out to get your colonoscopy report and biopsies from [facility].  Today we discussed findings on your MRI which confirm deeply invasive endometriosis.  The MRI has several findings that raise the concern for possible malignancy.  While uncommon, we can see cancer arising in endometriosis.  The options we discussed today would be close follow-up imaging with an MRI 3 months after your last while you continue on hormonal suppression.  The other is to move forward with scheduling surgery.  Given involvement of your colon and possibly small bowel and the extent of surgery as well as risk for blood loss, I recommend that surgery be coordinated at [facility].  Please do not hesitate to call with any questions as we work on the referral and getting your records.

## 2021-07-01 NOTE — Progress Notes (Signed)
GYNECOLOGIC ONCOLOGY NEW PATIENT CONSULTATION   Patient Name: Casey Lloyd  Patient Age: 45 y.o. 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   I reviewed in detail with the patient her recent MRI findings, both those related to deeply infiltrative endometriosis as well as those that raise the concern for possible malignancy. While uncommon, there is a risk of malignancy arising in the setting of endometriosis, notably endometrioid and clear cell carcinoma.  MRI is not diagnostic of cancer, and unfortunately, we do not have previous MRIs with which to make comparison.   The patient has recently undergone colonoscopy. These records are not currently available to me. The patient was asked to sign a release of records so that my office can work on obtaining colonoscopy report and any biopsy pathology from her GI. Per her report, it sounds like there may be transmural involvement of the sigmoid or rectum by endometriosis.  By exam, both the rectum and ovarian mass are adherent to the cervix. Surgery, both from a therapeutic standpoint (for her endometriosis) and a diagnostic one (to rule out malignant transformation), is likely to be extensive, require at least a large bowel resection, and may be significantly morbid.   The patient is relatively asymptomatic on norethindrone. In the setting of no concern for malignancy, I would recommend continued hormonal treatment. We briefly discussed the use of Lupron in the setting of endometriosis (she has not been on this before) and  its side effects.  From a management standpoint, I offered several options. One would be to repeat MRI imaging and CA-125 at a close interval (3 months). If findings continued to be concerning for (or more concerning for) possible malignant transformation, then surgery would be revisited. Another option would be to proceed with plans for surgery now. Given anticipated need for bowel surgery, significant surgical complexity, and possible need for blood transfusion, I would recommend surgery at [facility]. This could either be with myself or one of my partners. I discussed that my clinical time is limited at [facility] given my practice here in [place]. I would prefer referral to one of my partner's and, if the patient elects to move forward with surgery, I will do my best to be present on the time of her surgery.   The patient is favoring moving forward with definitive surgery. She voices good understanding of the risks of surgery given endometriosis and known adhesive disease. She will think about decision further as we work to get records from her colonoscopy and make referral to one of my partners at [facility].   A copy of this note was sent to the patient's referring provider.   90 minutes of total time was spent for this patient encounter, including preparation, face-to-face counseling with the patient and coordination of care, and documentation of the encounter.   Jeral Pinch, MD  Division of Gynecologic Oncology  Department of Obstetrics and Gynecology  [facility]  ___________________________________________  Chief Complaint: Chief Complaint  Patient presents with   Endometriosis of ovary    History of Present Illness:  Casey Lloyd is a 45 y.o. y.o. female who is seen  in consultation at the request of Dr. Mancel Bale for an evaluation of endometriosis.  Below is summary of patient's history:  The patient has a history of supracervical hysterectomy via laparotomy in  August 2013. Significant pelvic adhesions with the tubes and ovaries were noted at that time and these structures were not removed. She carried a diagnosis of endometriosis, however her hysterectomy was performed because of symptomatic uterine fibroids. Postoperatively she developed persistent cyclical light vaginal spotting. She underwent her first postoperative ultrasound 2014 per patient which she states revealed a cyst on one of her ovaries. As part of follow-up for this cyst the ultrasound was repeated in February 2015, and showed persistent and stable size of the cyst. Her bleeding was attributed to residual endometrial glands in the cervical stump. She denies pelvic pain or symptoms from ovarian cysts.    She was seen for her scheduled follow-up in September 2016 and underwent a repeat ultrasound scan of Dr. Mancel Bale office on 04/22/2015. This showed surgically absent uterus. The right ovary contained a simple appearing cystic mass measuring 4.8 x 2.7 x 2.8 cm and this was slightly increased in size from previous scan. No flow was seen to the area oncology Doppler. The left ovary contained 2 separate complex cystic areas one measuring 3.8 x 2 x 2.9 cm and the other merit measuring 3 x 1.7 x 2.8 cm. No flow was seen to these areas oncology Doppler. These were described as possible endometriomas. A CA-125 was drawn on 04/30/2015 and was mildly elevated at 41 units per milliliter.  The patient was initially see in our clinic at this time for consultation with recommendation for close surveillance.  Repeat ultrasound on 03/10/2017 showed cervix normal to be normal in appearance. The right ovary contained a similar appearing nonvascular mass that appeared cystic with low level echoes throughout. Was felt to likely be an endometrioma measuring 3.9 x 2.8 x 3.5 cm. Previously was 3.1 x 3 x 3 cm. The left ovary contained a simple appearing cyst measuring 3.1 x 3.2 x 3.6 cm. The previously described masses on  either ultrasounds have resolved. There is no free fluid. The performed another CA-125 that was 93.   The patient was seen again in our clinic for consultation with continued surveillance recommended.  More recently, pelvic ultrasound on 04/28/2021 showed previously measured right endometrioma now 2.3 x 1.8 x 2.2 cm.  Complex cystic structure seen on right ovary measures 6.8 x 3.2 x 4.2 cm.  Left ovary appears normal.  No free fluid noted in the cul-de-sac. CA-125 on 04/28/21 was 85.6.  Pelvic MRI on 06/04/2021 showed cystic and solid area, mainly cystic measuring 8.2 x 5.9 x 8.1 cm.  More solid-appearing area along the ventral surface of the colon is contiguous with the cervix measuring 19 x 15 mm.  Dominant cystic area to the right of midline shows shading on T2 weighted images and is markedly hyperintense on T1-weighted images with numerous other smaller cystic areas displaying similar bladder features.  Dominant area measures 6.7 x 4.3 cm.  Ovarian vessels can be followed into this process on both the left and the right.  Signs of pelvic endometriosis in addition to cystic endometriomas and deep pelvic infiltrating disease which tethers the cervix, colon, ovaries, and adjacent small bowel.  Area of marked hyperenhancement which is more so than adjacent small bowel with nodular area along the ventral surface of the rectum, no gross evidence of restricted diffusion in this area but enhancement more so than would  be expected for simple deep infiltrating disease, raising the question of malignant transformation.  Interval history: Today, the patient notes that the pelvic pain that she was having previously, has completely resolved as of starting norethindrone about a month ago.  Prior to starting this medication, she was having left-sided pelvic pain on some days intermittently.  At its worst, she described it as a 7-8 out of 10, dull pain, sometimes with radiation to the vagina and her right  buttocks.  Previously, the patient was on [place] for about a year.  She had significant side effects related to the medication but had no pelvic pain or bleeding.  She endorses some decreased appetite secondary to anxiety, she denies any nausea or emesis.  She notes baseline bloating and early satiety, unchanged recently.  She has had some intentional weight loss (about 10 pounds).  She endorses normal bowel function and denies any pain with defecation or bleeding.  She has occasional urinary urgency with incontinence, mostly when she drinks caffeine.  She was seen at [facility] recently for colonoscopy on the 23rd.  There is a least 1 I area biopsied that she reports the gastroenterologist did not think was cancer.  It sounds like there were some findings of endometriosis invasive through the mucosa.  PAST MEDICAL HISTORY:  Past Medical History:  Diagnosis Date   Abnormal Pap smear 2004   colpo./leep   Bacterial infection    BV (bacterial vaginosis) 12/2005   Chlamydial infection    Dysplasia of cervix, low grade (CIN 1) 05/31/11   Fibroid 01/10/06   GERD (gastroesophageal reflux disease)    no medication   H/O amenorrhea    H/O dysmenorrhea 09/28/07   H/O measles    H/O varicella    Hx of colposcopy with cervical biopsy    Irregular menses    Menometrorrhagia 09/23/06   Trichomonas    Urinary retention with incomplete bladder emptying    Yeast infection      PAST SURGICAL HISTORY:  Past Surgical History:  Procedure Laterality Date   ABDOMINAL HYSTERECTOMY  03/02/2012   Procedure: HYSTERECTOMY ABDOMINAL;  Surgeon: Delice Lesch, MD;  Location: [facility];  Service: Gynecology;  Laterality: N/A;   CERVICAL BIOPSY  W/ LOOP ELECTRODE EXCISION     before hysterectomy - normal paps since   CESAREAN SECTION     CYSTOSCOPY  03/02/2012   Procedure: CYSTOSCOPY;  Surgeon: Delice Lesch, MD;  Location: [facility];  Service: Gynecology;  Laterality: N/A;   DILATION AND CURETTAGE OF UTERUS       OB/GYN HISTORY:  OB History  Gravida Para Term Preterm AB Living  2 1 1     1   SAB IAB Ectopic Multiple Live Births               # Outcome Date GA Lbr Len/2nd Weight Sex Delivery Anes PTL Lv  2 Term           1 Gravida             No LMP recorded.  Age at menarche: 82 Age at menopause: Periods stopped at 9 with her hysterectomy, has not gone through menopause Hx of HRT: Denies Hx of STDs: Yes Last pap: 03/05/21- NIML, HR HPV negative History of abnormal pap smears: Yes  SCREENING STUDIES:  Last mammogram: 2019  Last colonoscopy: 06/2021  MEDICATIONS: Outpatient Encounter Medications as of 07/01/2021  Medication Sig   naproxen (NAPRELAN) 500 MG 24 hr tablet naproxen sodium ER (CR)  500 mg tablet,extended release 24 hr mphase  TAKE 2 TABLETS BY MOUTH EVERY DAY AS NEEDED   norethindrone (AYGESTIN) 5 MG tablet norethindrone acetate 5 mg tablet  TAKE 2 TABLETS BY MOUTH EVERY DAY   Relugolix-Estradiol-Norethind (MYFEMBREE) 40-1-0.5 MG TABS Myfembree 40 mg-1 mg-0.5 mg tablet  Take 1 tablet every day by oral route.   valACYclovir (VALTREX) 500 MG tablet Take 500 mg by mouth daily.   No facility-administered encounter medications on file as of 07/01/2021.    ALLERGIES:  No Known Allergies   FAMILY HISTORY:  Family History  Problem Relation Age of Onset   Hypertension Mother    Colon cancer Father    Hypertension Maternal Aunt    Hypertension Maternal Grandmother    Hypertension Maternal Grandfather    Diabetes Maternal Grandfather    Prostate cancer Neg Hx    Pancreatic cancer Neg Hx    Ovarian cancer Neg Hx    Endometrial cancer Neg Hx    Breast cancer Neg Hx      SOCIAL HISTORY:  Social Connections: Not on file    REVIEW OF SYSTEMS:  Pertinent positives as per HPI Denies appetite changes, fevers, chills, fatigue, unexplained weight changes. Denies hearing loss, neck lumps or masses, mouth sores, ringing in ears or voice changes. Denies cough or  wheezing.  Denies shortness of breath. Denies chest pain or palpitations. Denies leg swelling. Denies abdominal distention, pain, blood in stools, constipation, diarrhea, nausea, vomiting, or early satiety. Denies pain with intercourse, dysuria, frequency, hematuria or incontinence. Denies hot flashes, vaginal bleeding or vaginal discharge.   Denies joint pain, back pain or muscle pain/cramps. Denies itching, rash, or wounds. Denies dizziness, headaches, numbness or seizures. Denies swollen lymph nodes or glands, denies easy bruising or bleeding. Denies anxiety, depression, confusion, or decreased concentration.  Physical Exam:  Vital Signs for this encounter:  Blood pressure (!) 153/89, pulse (!) 118, temperature 99 F (37.2 C), temperature source Tympanic, resp. rate 18, height 5\' 4"  (1.626 m), weight 178 lb 3.2 oz (80.8 kg), SpO2 100 %. Body mass index is 30.59 kg/m. General: Alert, oriented, no acute distress.  HEENT: Normocephalic, atraumatic. Sclera anicteric.  Chest: Clear to auscultation bilaterally. No wheezes, rhonchi, or rales. Cardiovascular: Mildly tachycardic with heart rate in the 100s, regular rhythm, no murmurs, rubs, or gallops.  Abdomen: Normoactive bowel sounds. Soft, nondistended, nontender to palpation. No masses or hepatosplenomegaly appreciated. No palpable fluid wave.  Well-healed Pfannenstiel incision. Extremities: Grossly normal range of motion. Warm, well perfused. No edema bilaterally.  Skin: No rashes or lesions.  Lymphatics: No cervical, supraclavicular, or inguinal adenopathy.  GU:  Normal external female genitalia. No lesions. No discharge or bleeding.             Bladder/urethra:  No lesions or masses, well supported bladder             Vagina: Well rugated, no lesions or masses.             Cervix: Normal appearing, no lesions.             Bimanual and rectal exam: Uterus is surgically absent.  Better appreciated on rectovaginal exam, the rectum is  tethered to the posterior cervix and there is fullness in the cul-de-sac but also feels tethered to both the cervix and rectum.  LABORATORY AND RADIOLOGIC DATA:  Outside medical records were reviewed to synthesize the above history, along with the history and physical obtained during the visit.   Lab Results  Component Value Date   WBC 12.0 (H) 03/03/2012   HGB 9.0 (L) 03/03/2012   HCT 27.6 (L) 03/03/2012   PLT 104 (L) 03/03/2012   TSH 1.413 12/17/2011   MRI pelvis 11/3: IMPRESSION: Signs of pelvic endometriosis with cystic endometriomas and deep pelvic infiltrating disease which tethers cervix, colon, ovaries and adjacent small bowel.   Area of marked hyperenhancement much more so than adjacent small bowel with nodular area along the ventral surface of the rectum. No gross evidence of restricted diffusion in this area; however, enhancement more so than would be expected for simple deep infiltrating disease, raising the question of malignant transformation. Correlation with tumor markers or PET scan may be helpful. Given colonic invasion could also consider the possibility of endoscopic biopsy as warranted.   Cystic endometriomas elsewhere associated with the above process without internal nodular enhancement.   Comparison with prior imaging may also be helpful if available.   Trace ascites.   No sign of bowel obstruction to the extent evaluated.

## 2021-07-02 ENCOUNTER — Telehealth: Payer: Self-pay | Admitting: *Deleted

## 2021-07-02 NOTE — Telephone Encounter (Signed)
Per Dr Berline Lopes fax records to [facility] for Dr Clarene Essex

## 2021-07-16 ENCOUNTER — Telehealth: Payer: Self-pay | Admitting: *Deleted

## 2021-07-16 NOTE — Telephone Encounter (Signed)
Per Dr Berline Lopes called and scheduled the patient for a follow up on 3/10 at 1:45 pm

## 2021-07-21 ENCOUNTER — Other Ambulatory Visit: Payer: Self-pay | Admitting: Gynecologic Oncology

## 2021-07-21 DIAGNOSIS — N80552 Deep endometriosis of other parts of the colon: Secondary | ICD-10-CM

## 2021-07-23 ENCOUNTER — Telehealth: Payer: Self-pay | Admitting: *Deleted

## 2021-07-23 NOTE — Telephone Encounter (Signed)
Per Dr Berline Lopes scheduled the patient for an MRI on 3/6 at 10 am at [facility]. Called and left the patient a message to call the office back.

## 2021-07-23 NOTE — Telephone Encounter (Signed)
Patient returned call and was given the date/time and instructions for the MRI

## 2021-09-30 ENCOUNTER — Other Ambulatory Visit: Payer: Self-pay | Admitting: Obstetrics and Gynecology

## 2021-09-30 DIAGNOSIS — Z1231 Encounter for screening mammogram for malignant neoplasm of breast: Secondary | ICD-10-CM

## 2021-10-05 ENCOUNTER — Other Ambulatory Visit: Payer: Self-pay

## 2021-10-05 ENCOUNTER — Ambulatory Visit: Admission: RE | Admit: 2021-10-05 | Payer: Managed Care, Other (non HMO) | Source: Ambulatory Visit

## 2021-10-05 ENCOUNTER — Ambulatory Visit (HOSPITAL_COMMUNITY)
Admission: RE | Admit: 2021-10-05 | Discharge: 2021-10-05 | Disposition: A | Discharge status: Home / Self Care | Payer: Managed Care, Other (non HMO) | Source: Ambulatory Visit | Attending: Gynecologic Oncology | Admitting: Gynecologic Oncology

## 2021-10-05 ENCOUNTER — Ambulatory Visit (HOSPITAL_COMMUNITY): Payer: Managed Care, Other (non HMO)

## 2021-10-05 DIAGNOSIS — N80552 Deep endometriosis of other parts of the colon: Secondary | ICD-10-CM | POA: Diagnosis not present

## 2021-10-05 DIAGNOSIS — Z1231 Encounter for screening mammogram for malignant neoplasm of breast: Secondary | ICD-10-CM

## 2021-10-05 IMAGING — MR MR PELVIS WO/W CM
19 of 20 series · 46 of 48 positions shown · IV contrast (8ML GADAVIST)
Comparison: [DATE]

CLINICAL DATA: Endometriosis.

EXAM:
MRI PELVIS WITHOUT AND WITH CONTRAST
TECHNIQUE: Multiplanar multisequence MR imaging of the pelvis was performed
both before and after administration of intravenous contrast.
CONTRAST:  8mL GADAVIST GADOBUTROL 1 MMOL/ML IV SOLN

[Series 2: T2 · coronal · 6.0mm · 1.56mm/px · 2 of 30 slices shown (1 of 4)]
[im 1/30]
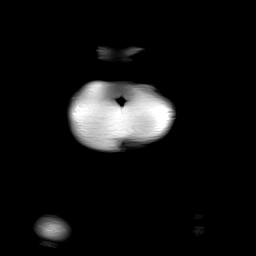
[im 30/30]
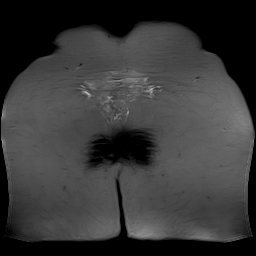

[Series 3: T2 fat-sat · axial · 5.0mm · 0.51mm/px · 1 of 34 slices shown]
[im 1/34]
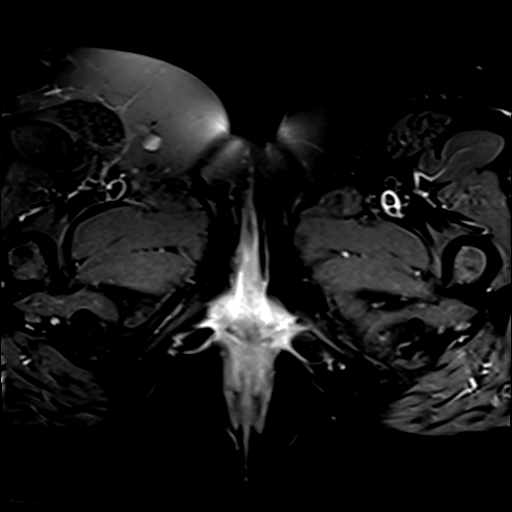

[Series 4: T2 · axial · 5.0mm · 0.51mm/px · 1 of 34 slices shown (2 of 4)]
[im 1/34]
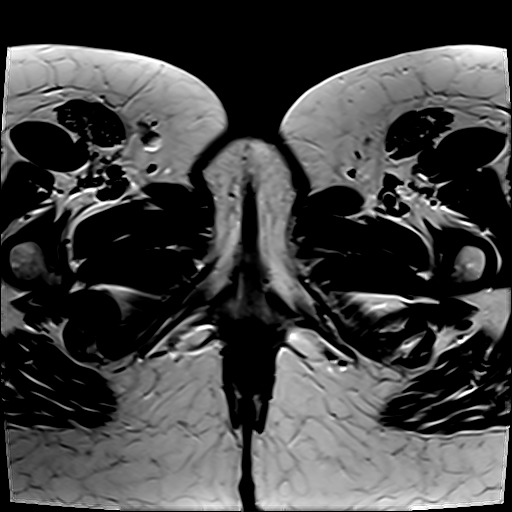

[Series 5: T2 · coronal · 4.0mm · 0.51mm/px · 1 of 34 slices shown (3 of 4)]
[im 1/34]
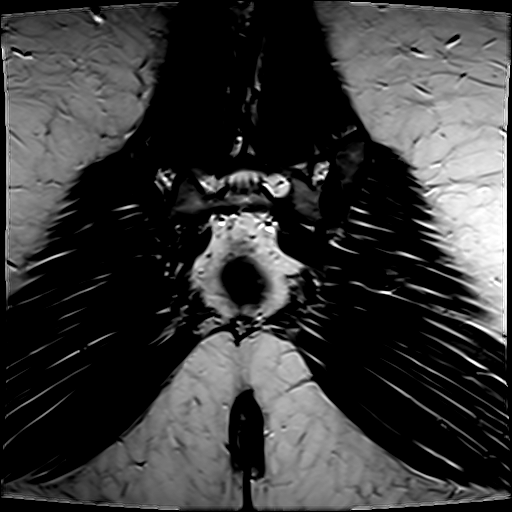

[Series 6: T2 · sagittal · 5.0mm · 0.55mm/px · 2 of 40 slices shown (4 of 4)]
[im 1/40]
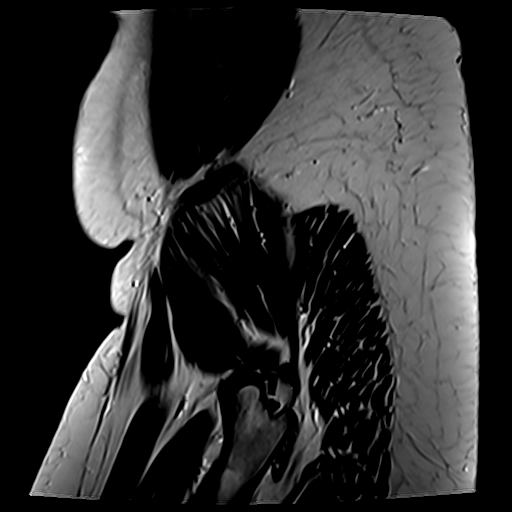
[im 40/40]
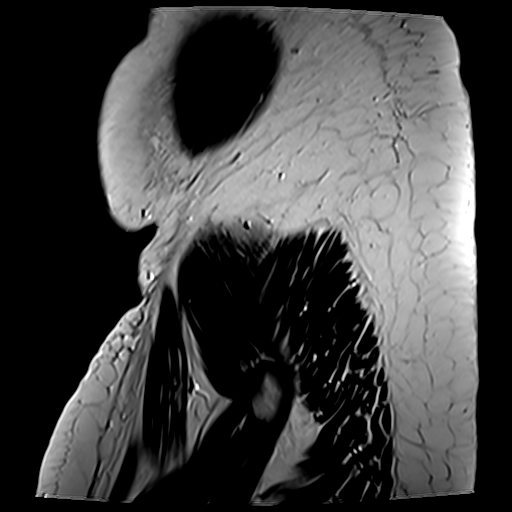

[Series 7: T1 · axial · 4.0mm · 0.84mm/px · z∈[-143,+141]mm · 3 of 72 slices shown (1 of 2)]
[im 1/72]
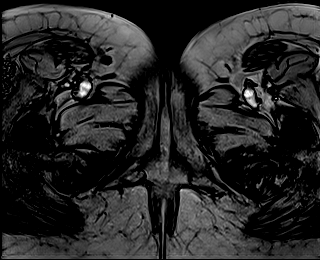
[im 36/72]
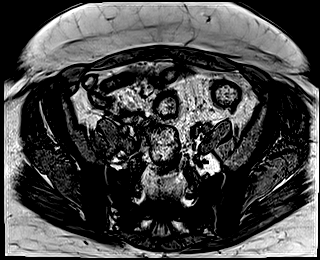
[im 72/72]
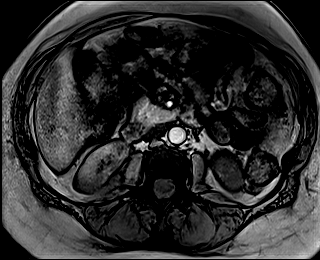

[Series 8: T1 · axial · 4.0mm · 0.84mm/px · z∈[-143,+141]mm · 3 of 72 slices shown (2 of 2)]
[im 1/72]
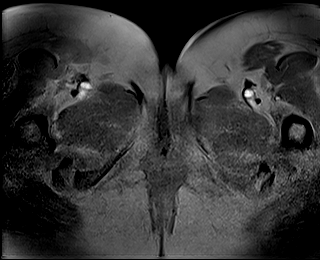
[im 36/72]
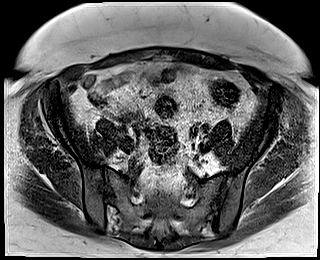
[im 72/72]
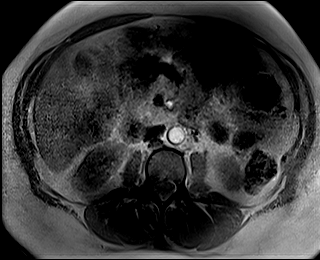

[Series 9: DWI · axial · 5.0mm · 2.80mm/px · z∈[-116,+29]mm · 3 of 90 slices shown (1 of 3)]
[im 1/90]
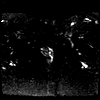
[im 45/90]
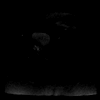
[im 90/90]
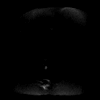

[Series 10: DWI · axial · 5.0mm · 2.80mm/px · 1 of 30 slices shown (2 of 3)]
[im 1/30]
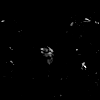

[Series 11: DWI · axial · 5.0mm · 2.80mm/px · 1 of 30 slices shown (3 of 3)]
[im 1/30]
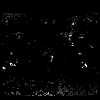

[Series 13: T1 dynamic · axial · 3.0mm · 0.84mm/px · z∈[-163,+74]mm · 3 of 80 slices shown (1 of 7)]
[im 1/80]
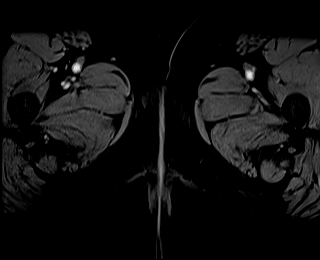
[im 40/80]
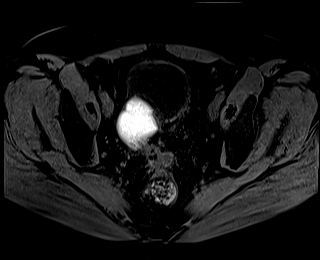
[im 80/80]
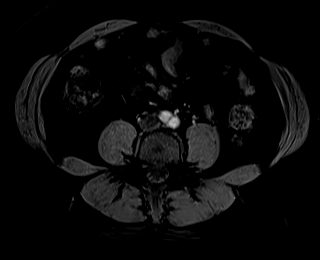

[Series 16: T1 dynamic · axial · 3.0mm · 0.84mm/px · z∈[-163,+74]mm · 3 of 80 slices shown (2 of 7)]
[im 1/80]
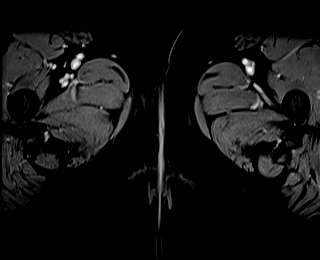
[im 40/80]
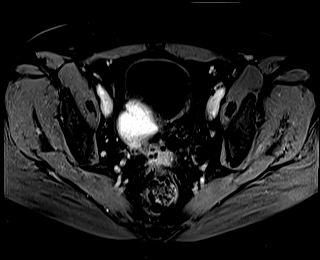
[im 80/80]
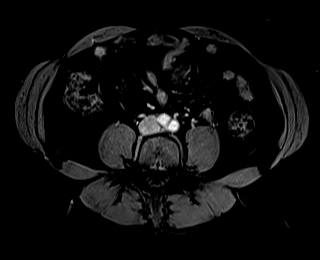

[Series 17: T1 dynamic · axial · 3.0mm · 0.84mm/px · z∈[-163,+74]mm · 3 of 80 slices shown (3 of 7)]
[im 1/80]
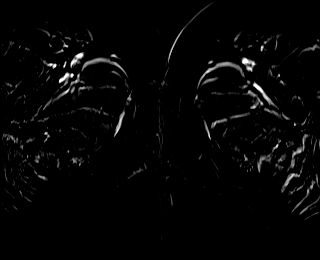
[im 40/80]
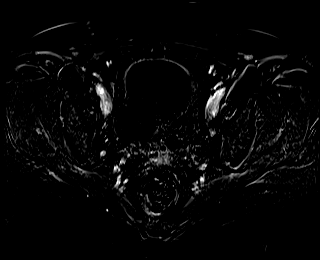
[im 80/80]
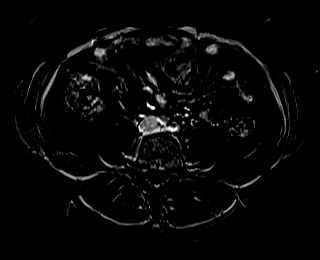

[Series 20: T1 dynamic · axial · 3.0mm · 0.84mm/px · z∈[-163,+74]mm · 3 of 80 slices shown (4 of 7)]
[im 1/80]
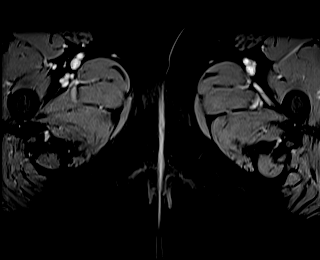
[im 40/80]
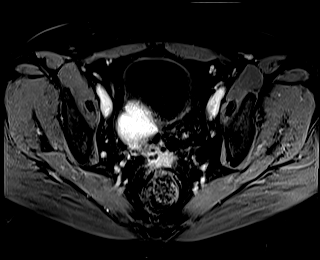
[im 80/80]
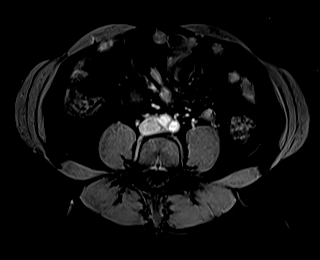

[Series 21: T1 dynamic · axial · 3.0mm · 0.84mm/px · z∈[-163,+74]mm · 3 of 80 slices shown (5 of 7)]
[im 1/80]
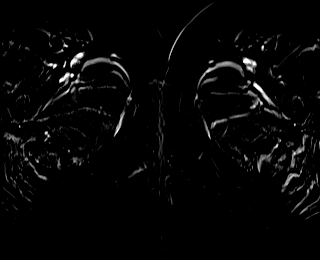
[im 40/80]
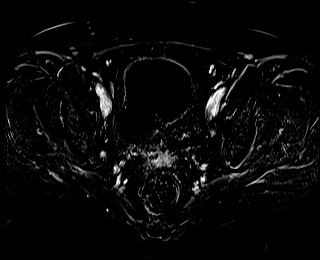
[im 80/80]
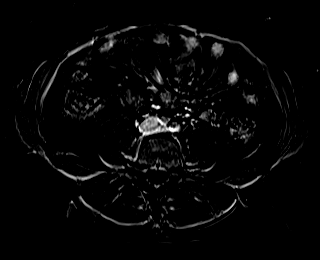

[Series 24: T1 dynamic · axial · 3.0mm · 0.84mm/px · z∈[-163,+74]mm · 3 of 80 slices shown (6 of 7)]
[im 1/80]
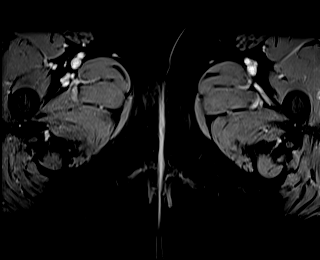
[im 40/80]
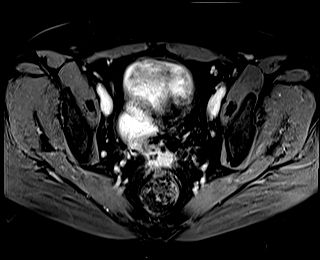
[im 80/80]
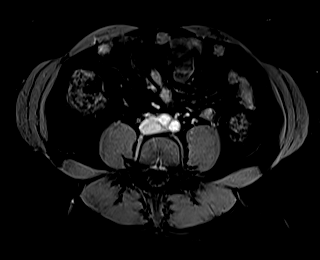

[Series 25: T1 dynamic · axial · 3.0mm · 0.84mm/px · z∈[-163,+74]mm · 3 of 80 slices shown (7 of 7)]
[im 1/80]
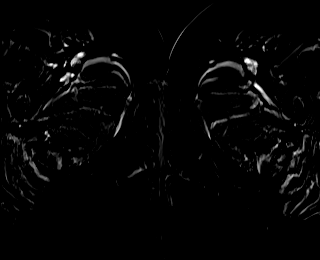
[im 40/80]
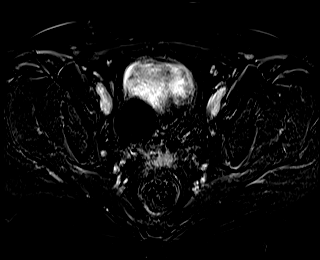
[im 80/80]
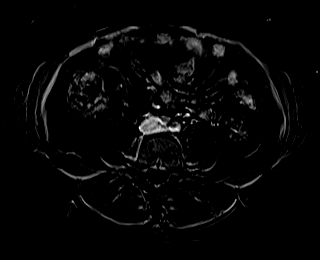

[Series 27: T1 dynamic post-contrast · axial · 3.0mm · 1.06mm/px · z∈[-115,+122]mm · 3 of 80 slices shown (1 of 2)]
[im 1/80]
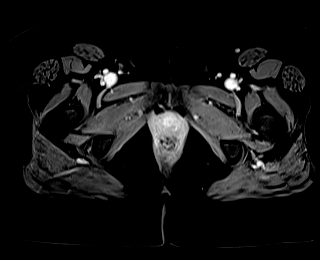
[im 40/80]
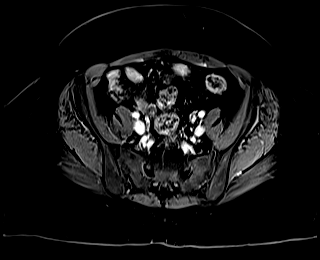
[im 80/80]
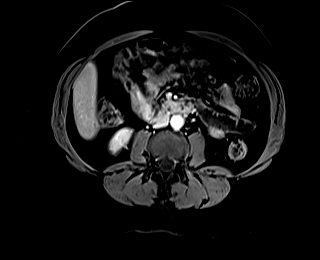

[Series 29: T1 dynamic post-contrast · sagittal · 3.0mm · 0.88mm/px · 4 of 96 slices shown (2 of 2)]
[im 1/96]
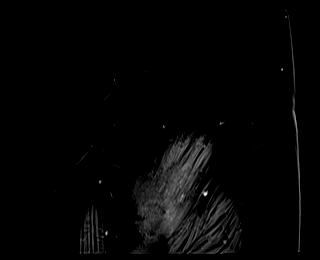
[im 32/96]
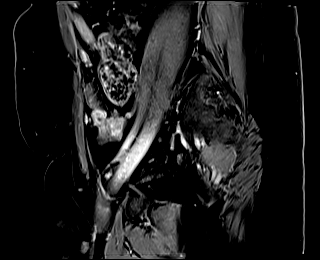
[im 64/96]
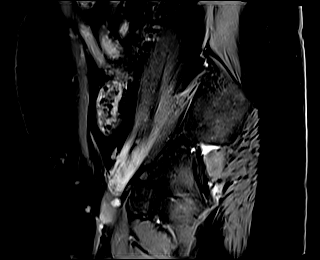
[im 96/96]
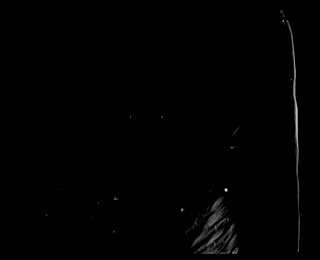

[46 of 48 positions shown; findings below may reference images not displayed]

FINDINGS: Lower Urinary Tract: No urinary bladder or urethral abnormality
identified.

Bowel: See reproductive section below.

Vascular/Lymphatic: Unremarkable. No pathologically enlarged pelvic
lymph nodes identified.

Reproductive:

-- Uterus: Prior supracervical hysterectomy. Poorly defined
enhancing soft tissue density is seen in the surgical bed. This
shows involvement of the cervix and rectum, and also causes
tethering of a pelvic small bowel loop. This is consistent with
endometriosis.

-- Right ovary: A cystic lesion with hemorrhagic fluid and T2
shading is again seen in the right adnexa, which shows no evidence
solid mural nodule or internal septations. This measures 4.6 x
cm on image 39/13, decreased in size from 6.7 x 4.7 cm on prior
exam. This is consistent with an endometrioma.

-- Left ovary: A small T1 hyperintense cystic lesion is seen in the
left adnexa measuring 1.6 x 1.1 cm, decreased in size from 2.1 x
cm on prior exam. This is consistent with a small endometrioma.

Other: No evidence of free fluid.

Musculoskeletal:  Unremarkable.
IMPRESSION: Prior supracervical hysterectomy. No significant change in poorly
defined soft tissue density involving the cervix and rectum, and
causing tethering of a pelvic small bowel loop, consistent with
endometriosis.

Small endometriomas in bilateral adnexal regions, mildly decreased
in size since previous study.

No evidence of new or progressive disease within the pelvis.

## 2021-10-05 MED ORDER — GADOBUTROL 1 MMOL/ML IV SOLN
8.0000 mL | Freq: Once | INTRAVENOUS | Status: AC | PRN
  Administered 2021-10-05: 8 mL via INTRAVENOUS

## 2021-10-08 ENCOUNTER — Encounter: Payer: Self-pay | Admitting: Gynecologic Oncology

## 2021-10-09 ENCOUNTER — Inpatient Hospital Stay: Payer: Managed Care, Other (non HMO) | Attending: Gynecologic Oncology | Admitting: Gynecologic Oncology

## 2021-10-09 ENCOUNTER — Other Ambulatory Visit: Payer: Self-pay

## 2021-10-09 ENCOUNTER — Encounter: Payer: Self-pay | Admitting: Gynecologic Oncology

## 2021-10-09 VITALS — BP 149/87 | HR 92 | Temp 98.1°F | Resp 14 | Ht 63.78 in | Wt 172.0 lb

## 2021-10-09 DIAGNOSIS — Z79890 Hormone replacement therapy (postmenopausal): Secondary | ICD-10-CM | POA: Diagnosis not present

## 2021-10-09 DIAGNOSIS — N80103 Endometriosis of bilateral ovaries, unspecified depth: Secondary | ICD-10-CM | POA: Diagnosis not present

## 2021-10-09 DIAGNOSIS — Z90710 Acquired absence of both cervix and uterus: Secondary | ICD-10-CM | POA: Diagnosis not present

## 2021-10-09 DIAGNOSIS — N809 Endometriosis, unspecified: Secondary | ICD-10-CM | POA: Insufficient documentation

## 2021-10-09 DIAGNOSIS — K5900 Constipation, unspecified: Secondary | ICD-10-CM

## 2021-10-09 DIAGNOSIS — N80552 Deep endometriosis of other parts of the colon: Secondary | ICD-10-CM | POA: Diagnosis not present

## 2021-10-09 DIAGNOSIS — N80109 Endometriosis of ovary, unspecified side, unspecified depth: Secondary | ICD-10-CM

## 2021-10-09 NOTE — Progress Notes (Signed)
Gynecologic Oncology Return Clinic Visit  10/09/2021  Reason for Visit: Follow-up in the setting of advanced endometriosis  Treatment History: The patient has a history of supracervical hysterectomy via laparotomy in August 2013. Significant pelvic adhesions with the tubes and ovaries were noted at that time and these structures were not removed. She carried a diagnosis of endometriosis, however her hysterectomy was performed because of symptomatic uterine fibroids. Postoperatively she developed persistent cyclical light vaginal spotting. She underwent her first postoperative ultrasound 2014 per patient which she states revealed a cyst on one of her ovaries. As part of follow-up for this cyst the ultrasound was repeated in February 2015, and showed persistent and stable size of the cyst. Her bleeding was attributed to residual endometrial glands in the cervical stump. She denies pelvic pain or symptoms from ovarian cysts.    She was seen for her scheduled follow-up in September 2016 and underwent a repeat ultrasound scan of Dr. Mancel Bale office on 04/22/2015. This showed surgically absent uterus. The right ovary contained a simple appearing cystic mass measuring 4.8 x 2.7 x 2.8 cm and this was slightly increased in size from previous scan. No flow was seen to the area oncology Doppler. The left ovary contained 2 separate complex cystic areas one measuring 3.8 x 2 x 2.9 cm and the other merit measuring 3 x 1.7 x 2.8 cm. No flow was seen to these areas oncology Doppler. These were described as possible endometriomas. A CA-125 was drawn on 04/30/2015 and was mildly elevated at 41 units per milliliter.   The patient was initially see in our clinic at this time for consultation with recommendation for close surveillance.   Repeat ultrasound on 03/10/2017 showed cervix normal to be normal in appearance. The right ovary contained a similar appearing nonvascular mass that appeared cystic with low level echoes  throughout. Was felt to likely be an endometrioma measuring 3.9 x 2.8 x 3.5 cm. Previously was 3.1 x 3 x 3 cm. The left ovary contained a simple appearing cyst measuring 3.1 x 3.2 x 3.6 cm. The previously described masses on either ultrasounds have resolved. There is no free fluid. The performed another CA-125 that was 93.    The patient was seen again in our clinic for consultation with continued surveillance recommended.   More recently, pelvic ultrasound on 04/28/2021 showed previously measured right endometrioma now 2.3 x 1.8 x 2.2 cm.  Complex cystic structure seen on right ovary measures 6.8 x 3.2 x 4.2 cm.  Left ovary appears normal.  No free fluid noted in the cul-de-sac. CA-125 on 04/28/21 was 85.6.   Pelvic MRI on 06/04/2021 showed cystic and solid area, mainly cystic measuring 8.2 x 5.9 x 8.1 cm.  More solid-appearing area along the ventral surface of the colon is contiguous with the cervix measuring 19 x 15 mm.  Dominant cystic area to the right of midline shows shading on T2 weighted images and is markedly hyperintense on T1-weighted images with numerous other smaller cystic areas displaying similar bladder features.  Dominant area measures 6.7 x 4.3 cm.  Ovarian vessels can be followed into this process on both the left and the right.  Signs of pelvic endometriosis in addition to cystic endometriomas and deep pelvic infiltrating disease which tethers the cervix, colon, ovaries, and adjacent small bowel.  Area of marked hyperenhancement which is more so than adjacent small bowel with nodular area along the ventral surface of the rectum, no gross evidence of restricted diffusion in this area but enhancement  more so than would be expected for simple deep infiltrating disease, raising the question of malignant transformation.  The patient establish care with me in November 2022.  At that time, her pelvic pain had completely resolved as of starting norethindrone about a month prior to seeing me.      Previously, the patient was on [place] for about a year.  She had significant side effects related to the medication but had no pelvic pain or bleeding.   She was seen at [facility] recently for colonoscopy on 06/24/21.  There is a least one area biopsied that she reports the gastroenterologist did not think was cancer.  It sounds like there were some findings of endometriosis invading through the mucosa.   Interval History: After her last visit with me, she had a consultation with one of my partners at [facility] to discuss possible surgery.  After options were reviewed with her including surgery (very likely to require large bowel resection) versus surveillance, the patient opted to proceed with surveillance.  She has continued norethindrone for symptom management.  Today, patient reports overall doing well.  She was recently diagnosed with hypertension and started on low-dose beta-blocker.  About monthly, she endorses what she describes as "phantom" menstrual pain.  This is a dull cramping that does not require the use of any pain medication.  She denies any vaginal bleeding since I saw her.  She struggles with intermittent constipation and uses MiraLAX as needed with good results.  She denies any urinary symptoms.  She notes some fluctuation in her weight (up and down about 5 or 6 pounds).  Past Medical/Surgical History: Past Medical History:  Diagnosis Date   Abnormal Pap smear 2004   colpo./leep   Bacterial infection    BV (bacterial vaginosis) 12/2005   Chlamydial infection    Dysplasia of cervix, low grade (CIN 1) 05/31/11   Fibroid 01/10/06   GERD (gastroesophageal reflux disease)    no medication   H/O amenorrhea    H/O dysmenorrhea 09/28/07   H/O measles    H/O varicella    Hx of colposcopy with cervical biopsy    Irregular menses    Menometrorrhagia 09/23/06   Trichomonas    Urinary retention with incomplete bladder emptying    Yeast infection     Past Surgical History:   Procedure Laterality Date   ABDOMINAL HYSTERECTOMY  03/02/2012   Procedure: HYSTERECTOMY ABDOMINAL;  Surgeon: Delice Lesch, MD;  Location: [facility];  Service: Gynecology;  Laterality: N/A;   CERVICAL BIOPSY  W/ LOOP ELECTRODE EXCISION     before hysterectomy - normal paps since   CESAREAN SECTION     CYSTOSCOPY  03/02/2012   Procedure: CYSTOSCOPY;  Surgeon: Delice Lesch, MD;  Location: [facility];  Service: Gynecology;  Laterality: N/A;   DILATION AND CURETTAGE OF UTERUS      Family History  Problem Relation Age of Onset   Hypertension Mother    Colon cancer Father    Hypertension Maternal Aunt    Hypertension Maternal Grandmother    Hypertension Maternal Grandfather    Diabetes Maternal Grandfather    Prostate cancer Neg Hx    Pancreatic cancer Neg Hx    Ovarian cancer Neg Hx    Endometrial cancer Neg Hx    Breast cancer Neg Hx     Social History   Socioeconomic History   Marital status: Single    Spouse name: Not on file   Number of children: Not on file  Years of education: Not on file   Highest education level: Not on file  Occupational History   Not on file  Tobacco Use   Smoking status: Never   Smokeless tobacco: Never  Substance and Sexual Activity   Alcohol use: No   Drug use: No   Sexual activity: Yes    Birth control/protection: Condom  Other Topics Concern   Not on file  Social History Narrative   Not on file   Social Determinants of Health   Financial Resource Strain: Not on file  Food Insecurity: Not on file  Transportation Needs: Not on file  Physical Activity: Not on file  Stress: Not on file  Social Connections: Not on file    Current Medications:  Current Outpatient Medications:    naproxen (NAPRELAN) 500 MG 24 hr tablet, naproxen sodium ER (CR) 500 mg tablet,extended release 24 hr mphase  TAKE 2 TABLETS BY MOUTH EVERY DAY AS NEEDED, Disp: , Rfl:    nebivolol (BYSTOLIC) 2.5 MG tablet, Take 2.5 mg by mouth daily., Disp: , Rfl:     norethindrone (AYGESTIN) 5 MG tablet, norethindrone acetate 5 mg tablet  TAKE 2 TABLETS BY MOUTH EVERY DAY, Disp: , Rfl:    valACYclovir (VALTREX) 500 MG tablet, Take 500 mg by mouth daily., Disp: , Rfl:   Review of Systems: Denies appetite changes, fevers, chills, fatigue, unexplained weight changes. Denies hearing loss, neck lumps or masses, mouth sores, ringing in ears or voice changes. Denies cough or wheezing.  Denies shortness of breath. Denies chest pain or palpitations. Denies leg swelling. Denies abdominal distention, pain, blood in stools, diarrhea, nausea, vomiting, or early satiety. Denies pain with intercourse, dysuria, frequency, hematuria or incontinence. Denies hot flashes, pelvic pain, vaginal bleeding or vaginal discharge.   Denies joint pain, back pain or muscle pain/cramps. Denies itching, rash, or wounds. Denies dizziness, headaches, numbness or seizures. Denies swollen lymph nodes or glands, denies easy bruising or bleeding. Denies anxiety, depression, confusion, or decreased concentration.  Physical Exam: BP (!) 149/87 (BP Location: Left Arm, Patient Position: Sitting)    Pulse 92    Temp 98.1 F (36.7 C) (Oral)    Resp 14    Ht 5' 3.78" (1.62 m)    Wt 172 lb (78 kg)    SpO2 100%    BMI 29.73 kg/m  General: Alert, oriented, no acute distress. HEENT: Normocephalic, atraumatic, sclera anicteric. Chest: Unlabored breathing on room air.  Laboratory & Radiologic Studies: Pelvic MRI 10/06/19: IMPRESSION: Prior supracervical hysterectomy. No significant change in poorly defined soft tissue density involving the cervix and rectum, and causing tethering of a pelvic small bowel loop, consistent with endometriosis.   Small endometriomas in bilateral adnexal regions, mildly decreased in size since previous study.   No evidence of new or progressive disease within the pelvis.   Assessment & Plan: Casey Lloyd is a 46 y.o. woman with Stage IV endometriosis who presents for  follow-up in the setting of medical management.  Patient is overall doing well and basically asymptomatic on norethindrone.  She is tolerating the medication without side effect.  We discussed her recent pelvic MRI which shows stable to mildly improved evidence of endometriosis.  We discussed again management options.  The patient understands the morbidity if she were to undergo surgery given involvement of her colon and small bowel.  Given her very well controlled symptoms as well as overall reassuring imaging, the patient would like to continue with medical management of her endometriosis.  We  discussed today planning on a phone visit in 3 months.  If symptoms remain stable, we will likely repeat a pelvic MRI in 6 months.  We discussed signs and symptoms that should prompt a phone call prior to her next scheduled visit.  26 minutes of total time was spent for this patient encounter, including preparation, face-to-face counseling with the patient and coordination of care, and documentation of the encounter.  Jeral Pinch, MD  Division of Gynecologic Oncology  Department of Obstetrics and Gynecology  [facility]

## 2021-10-09 NOTE — Patient Instructions (Signed)
It was a pleasure seeing you again today.  We will plan to have a phone visit in 3 months to check-in.  If at that time you continue to do well from a symptom standpoint, we will discuss repeating imaging in the fall to assure that things continue to look stable.  If you develop any new and concerning symptoms between now and your next visit, please call my office. ?

## 2021-11-02 ENCOUNTER — Ambulatory Visit
Admission: RE | Admit: 2021-11-02 | Discharge: 2021-11-02 | Disposition: A | Discharge status: Home / Self Care | Payer: Managed Care, Other (non HMO) | Source: Ambulatory Visit | Attending: Obstetrics and Gynecology | Admitting: Obstetrics and Gynecology

## 2021-11-02 DIAGNOSIS — Z1231 Encounter for screening mammogram for malignant neoplasm of breast: Secondary | ICD-10-CM

## 2021-11-02 IMAGING — MG MM DIGITAL SCREENING BILAT W/ TOMO AND CAD
6 of 10 series · 6 of 30 positions shown · non-contrast
Comparison: None.

CLINICAL DATA: Screening.

EXAM:
DIGITAL SCREENING BILATERAL MAMMOGRAM WITH TOMOSYNTHESIS AND CAD
TECHNIQUE: Bilateral screening digital craniocaudal and mediolateral oblique
mammograms were obtained. Bilateral screening digital breast
tomosynthesis was performed. The images were evaluated with
computer-aided detection.

[R MLO synth-2D (1 of 2)]
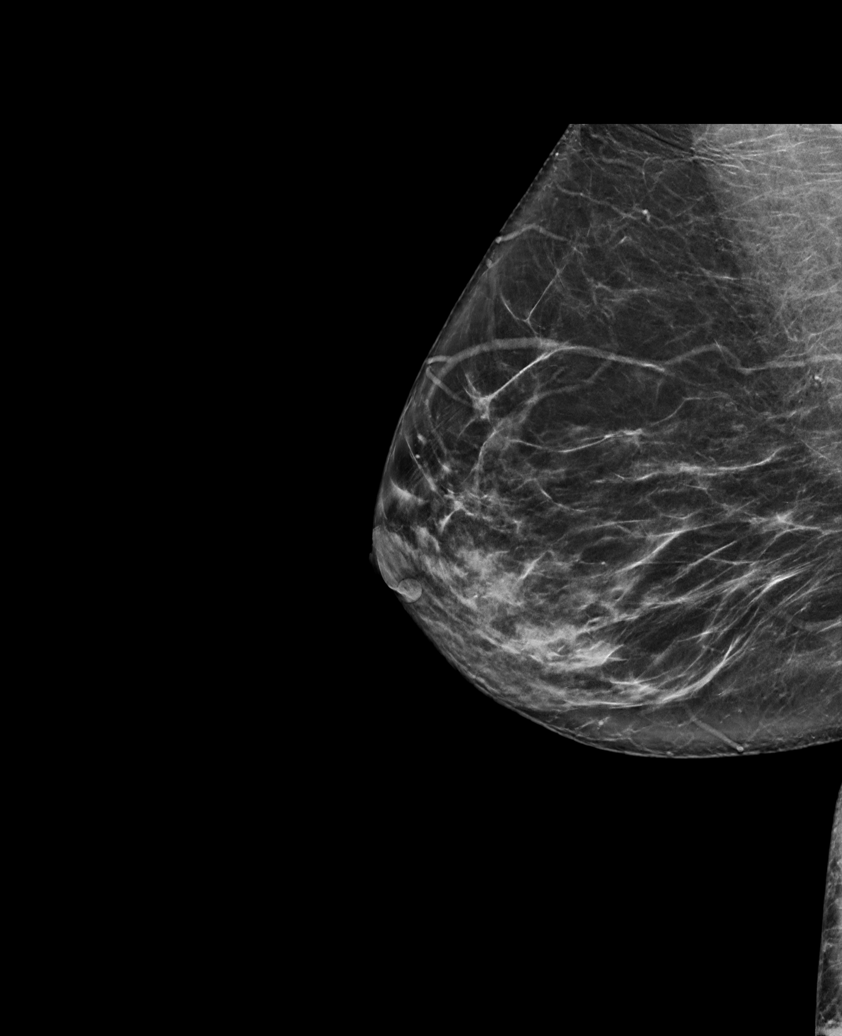

[L MLO synth-2D]
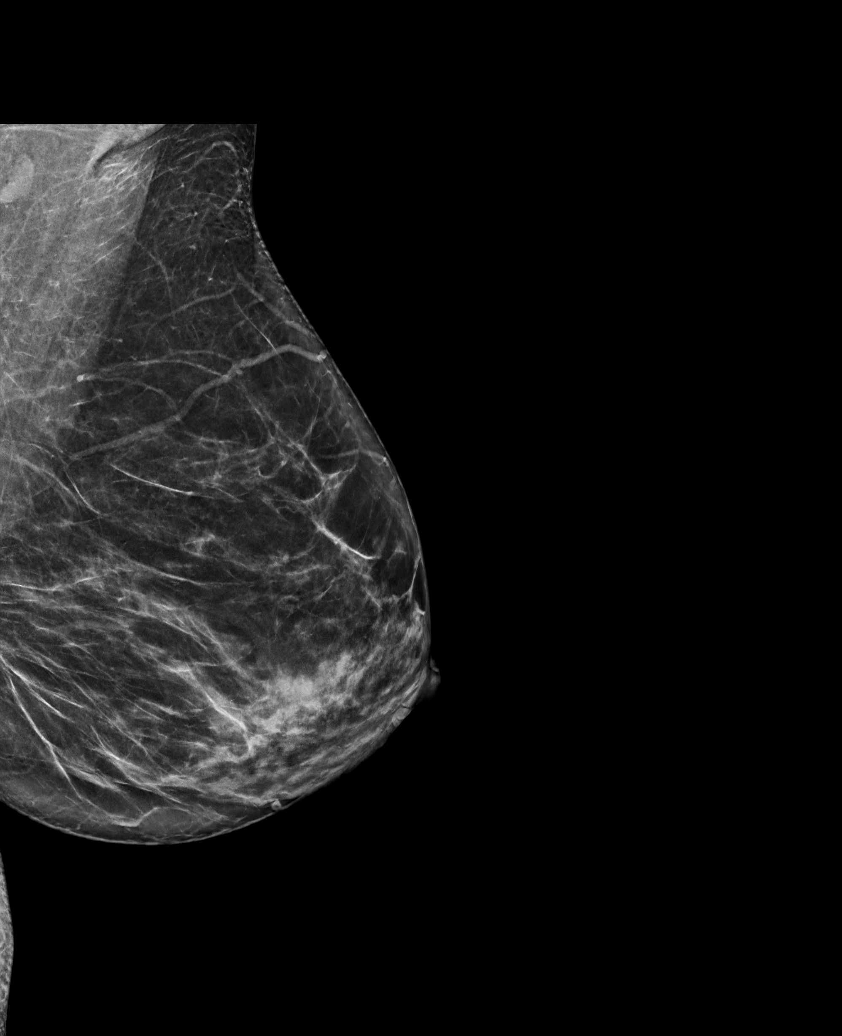

[R CC synth-2D]
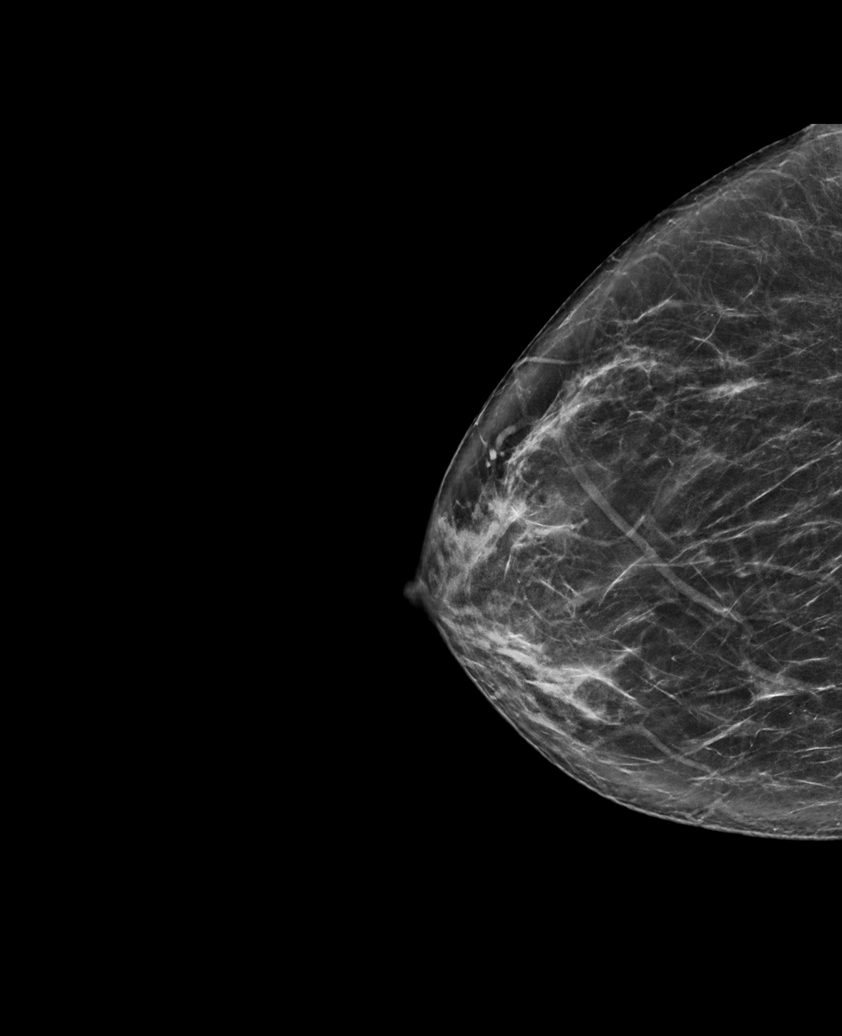

[R MLO synth-2D (2 of 2)]
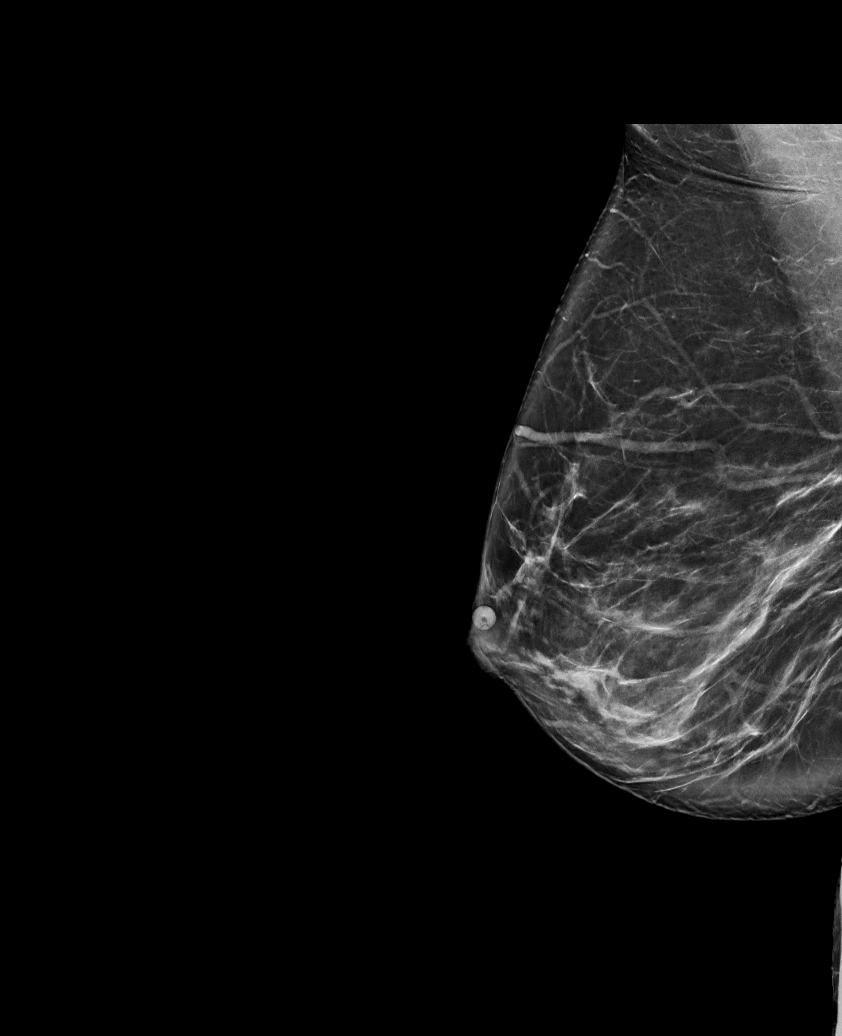

[L CC synth-2D]
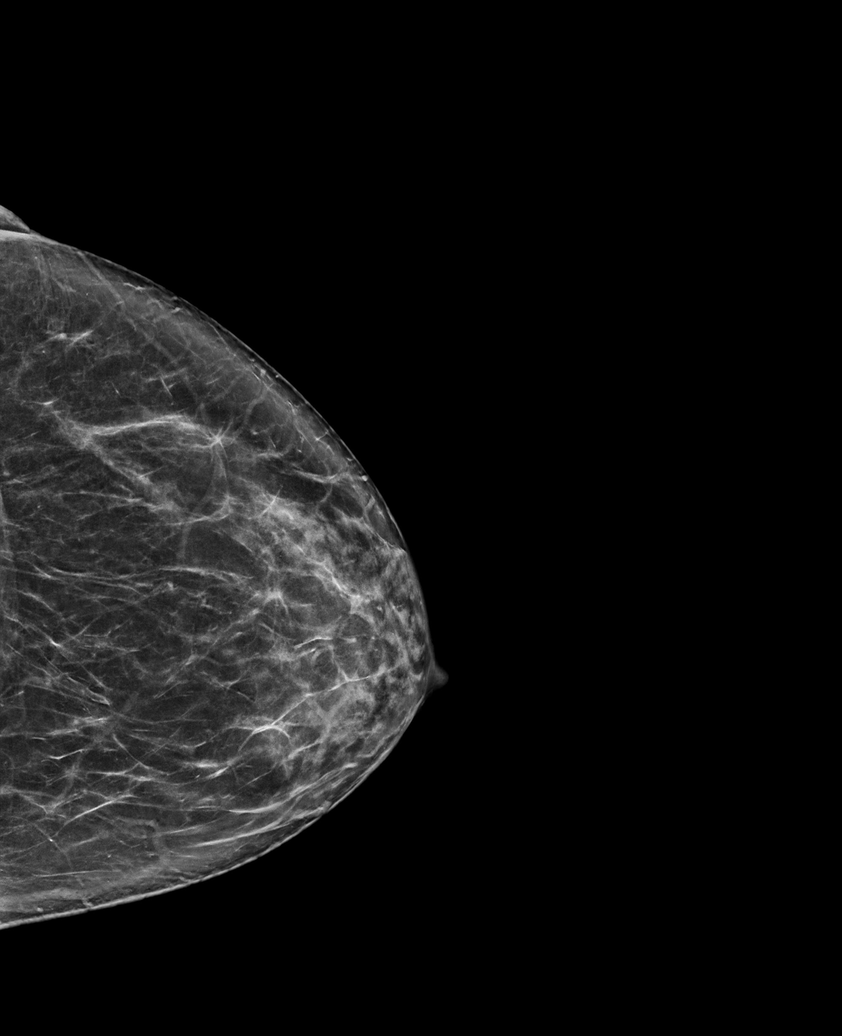

[L CC tomo · tomo slice 39/78.0]
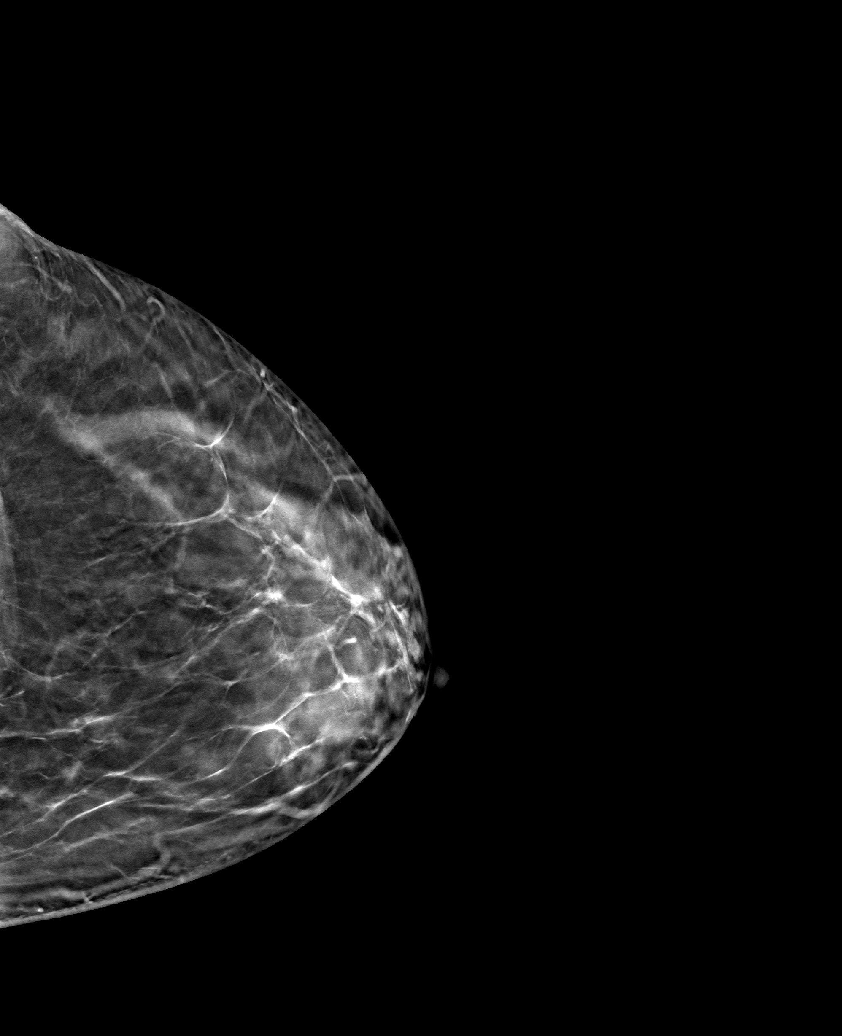

[6 of 30 positions shown; findings below may reference images not displayed]

ACR Breast Density Category b: There are scattered areas of
fibroglandular density.
FINDINGS: There are no findings suspicious for malignancy.
IMPRESSION: No mammographic evidence of malignancy. A result letter of this
screening mammogram will be mailed directly to the patient.

RECOMMENDATION:
Screening mammogram in one year. (Code:[4Q])

BI-RADS CATEGORY  1: Negative.

## 2022-01-12 ENCOUNTER — Inpatient Hospital Stay: Payer: Managed Care, Other (non HMO) | Attending: Gynecologic Oncology | Admitting: Gynecologic Oncology

## 2022-01-12 ENCOUNTER — Encounter: Payer: Self-pay | Admitting: Gynecologic Oncology

## 2022-01-12 DIAGNOSIS — N809 Endometriosis, unspecified: Secondary | ICD-10-CM

## 2022-01-12 NOTE — Progress Notes (Signed)
Gynecologic Oncology Telehealth Note: Gyn-Onc  I connected with [place] on 01/12/22 at  4:00 PM EDT by telephone and verified that I am speaking with the correct person using two identifiers.  I discussed the limitations, risks, security and privacy concerns of performing an evaluation and management service by telemedicine and the availability of in-person appointments. I also discussed with the patient that there may be a patient responsible charge related to this service. The patient expressed understanding and agreed to proceed.  Other persons participating in the visit and their role in the encounter: none.  Patient's location: [place] Provider's location: [place]  Reason for Visit: Follow-up in the setting of endometriosis  Treatment History: The patient has a history of supracervical hysterectomy via laparotomy in August 2013. Significant pelvic adhesions with the tubes and ovaries were noted at that time and these structures were not removed. She carried a diagnosis of endometriosis, however her hysterectomy was performed because of symptomatic uterine fibroids. Postoperatively she developed persistent cyclical light vaginal spotting. She underwent her first postoperative ultrasound 2014 per patient which she states revealed a cyst on one of her ovaries. As part of follow-up for this cyst the ultrasound was repeated in February 2015, and showed persistent and stable size of the cyst. Her bleeding was attributed to residual endometrial glands in the cervical stump. She denies pelvic pain or symptoms from ovarian cysts.    She was seen for her scheduled follow-up in September 2016 and underwent a repeat ultrasound scan of Dr. Mancel Bale office on 04/22/2015. This showed surgically absent uterus. The right ovary contained a simple appearing cystic mass measuring 4.8 x 2.7 x 2.8 cm and this was slightly increased in size from previous scan. No flow was seen to the area oncology  Doppler. The left ovary contained 2 separate complex cystic areas one measuring 3.8 x 2 x 2.9 cm and the other merit measuring 3 x 1.7 x 2.8 cm. No flow was seen to these areas oncology Doppler. These were described as possible endometriomas. A CA-125 was drawn on 04/30/2015 and was mildly elevated at 41 units per milliliter.   The patient was initially see in our clinic at this time for consultation with recommendation for close surveillance.   Repeat ultrasound on 03/10/2017 showed cervix normal to be normal in appearance. The right ovary contained a similar appearing nonvascular mass that appeared cystic with low level echoes throughout. Was felt to likely be an endometrioma measuring 3.9 x 2.8 x 3.5 cm. Previously was 3.1 x 3 x 3 cm. The left ovary contained a simple appearing cyst measuring 3.1 x 3.2 x 3.6 cm. The previously described masses on either ultrasounds have resolved. There is no free fluid. The performed another CA-125 that was 93.    The patient was seen again in our clinic for consultation with continued surveillance recommended.   More recently, pelvic ultrasound on 04/28/2021 showed previously measured right endometrioma now 2.3 x 1.8 x 2.2 cm.  Complex cystic structure seen on right ovary measures 6.8 x 3.2 x 4.2 cm.  Left ovary appears normal.  No free fluid noted in the cul-de-sac. CA-125 on 04/28/21 was 85.6.   Pelvic MRI on 06/04/2021 showed cystic and solid area, mainly cystic measuring 8.2 x 5.9 x 8.1 cm.  More solid-appearing area along the ventral surface of the colon is contiguous with the cervix measuring 19 x 15 mm.  Dominant cystic area to the right of midline shows shading on T2 weighted images  and is markedly hyperintense on T1-weighted images with numerous other smaller cystic areas displaying similar bladder features.  Dominant area measures 6.7 x 4.3 cm.  Ovarian vessels can be followed into this process on both the left and the right.  Signs of pelvic endometriosis in  addition to cystic endometriomas and deep pelvic infiltrating disease which tethers the cervix, colon, ovaries, and adjacent small bowel.  Area of marked hyperenhancement which is more so than adjacent small bowel with nodular area along the ventral surface of the rectum, no gross evidence of restricted diffusion in this area but enhancement more so than would be expected for simple deep infiltrating disease, raising the question of malignant transformation.   The patient establish care with me in November 2022.  At that time, her pelvic pain had completely resolved as of starting norethindrone about a month prior to seeing me.     Previously, the patient was on [place] for about a year.  She had significant side effects related to the medication but had no pelvic pain or bleeding.   She was seen at [facility] recently for colonoscopy on 06/24/21.  There is a least one area biopsied that she reports the gastroenterologist did not think was cancer.  It sounds like there were some findings of endometriosis invading through the mucosa.  Interval History: Denies pain other than occasional dull pelvic pain. Denies needing any pain medication. Denies bleeding. Occasional constipation, uses MirALAX. Denies urinary symptoms. Has some bloating intermittently for a few days, then resolves. Mostly associated with gas, diet.  Some migraines (maybe once a month) on norethindrone.   Past Medical/Surgical History: Past Medical History:  Diagnosis Date   Abnormal Pap smear 2004   colpo./leep   Bacterial infection    BV (bacterial vaginosis) 12/2005   Chlamydial infection    Dysplasia of cervix, low grade (CIN 1) 05/31/11   Fibroid 01/10/06   GERD (gastroesophageal reflux disease)    no medication   H/O amenorrhea    H/O dysmenorrhea 09/28/07   H/O measles    H/O varicella    Hx of colposcopy with cervical biopsy    Irregular menses    Menometrorrhagia 09/23/06   Trichomonas    Urinary retention with  incomplete bladder emptying    Yeast infection     Past Surgical History:  Procedure Laterality Date   ABDOMINAL HYSTERECTOMY  03/02/2012   Procedure: HYSTERECTOMY ABDOMINAL;  Surgeon: Delice Lesch, MD;  Location: [facility];  Service: Gynecology;  Laterality: N/A;   CERVICAL BIOPSY  W/ LOOP ELECTRODE EXCISION     before hysterectomy - normal paps since   CESAREAN SECTION     CYSTOSCOPY  03/02/2012   Procedure: CYSTOSCOPY;  Surgeon: Delice Lesch, MD;  Location: [facility];  Service: Gynecology;  Laterality: N/A;   DILATION AND CURETTAGE OF UTERUS      Family History  Problem Relation Age of Onset   Hypertension Mother    Colon cancer Father    Hypertension Maternal Aunt    Hypertension Maternal Grandmother    Hypertension Maternal Grandfather    Diabetes Maternal Grandfather    Prostate cancer Neg Hx    Pancreatic cancer Neg Hx    Ovarian cancer Neg Hx    Endometrial cancer Neg Hx    Breast cancer Neg Hx     Social History   Socioeconomic History   Marital status: Single    Spouse name: Not on file   Number of children: Not on file  Years of education: Not on file   Highest education level: Not on file  Occupational History   Not on file  Tobacco Use   Smoking status: Never   Smokeless tobacco: Never  Substance and Sexual Activity   Alcohol use: No   Drug use: No   Sexual activity: Yes    Birth control/protection: Condom  Other Topics Concern   Not on file  Social History Narrative   Not on file   Social Determinants of Health   Financial Resource Strain: Not on file  Food Insecurity: Not on file  Transportation Needs: Not on file  Physical Activity: Not on file  Stress: Not on file  Social Connections: Not on file    Current Medications:  Current Outpatient Medications:    naproxen (NAPRELAN) 500 MG 24 hr tablet, naproxen sodium ER (CR) 500 mg tablet,extended release 24 hr mphase  TAKE 2 TABLETS BY MOUTH EVERY DAY AS NEEDED, Disp: , Rfl:     nebivolol (BYSTOLIC) 2.5 MG tablet, Take 2.5 mg by mouth daily., Disp: , Rfl:    norethindrone (AYGESTIN) 5 MG tablet, norethindrone acetate 5 mg tablet  TAKE 2 TABLETS BY MOUTH EVERY DAY, Disp: , Rfl:    valACYclovir (VALTREX) 500 MG tablet, Take 500 mg by mouth daily., Disp: , Rfl:   Review of Symptoms: Pertinent positives as per HPI.  Physical Exam: There were no vitals taken for this visit. Deferred given limitations of phone visit.  Laboratory & Radiologic Studies: None new  Assessment & Plan: Casey Lloyd is a 46 y.o. woman with Stage IV endometriosis who presents for follow-up in the setting of medical management.  Patient overall continues to do well with symptoms controlled on norethindrone.  She has rare migraines, otherwise denies symptoms related to the medication.  Last MRI was 3 months ago, with stable to mild improvement of endometriosis.  We have previously discussed repeating imaging in 6 months.  I ordered that today and we will plan to have her get an MRI 3 months from now.  I will see her in clinic just after that MRI.  If her symptoms remain stable and imaging shows no worsening of her endometriosis or concern for malignancy (as was previously raised by a prior MRI), then we will likely plan for discharge back to her OB/GYN with consultation with me on an as needed did basis.  I discussed the assessment and treatment plan with the patient. The patient was provided with an opportunity to ask questions and all were answered. The patient agreed with the plan and demonstrated an understanding of the instructions.   The patient was advised to call back or see an in-person evaluation if the symptoms worsen or if the condition fails to improve as anticipated.   12 minutes of total time was spent for this patient encounter, including preparation, phone counseling with the patient and coordination of care, and documentation of the encounter.   Jeral Pinch, MD  Division  of Gynecologic Oncology  Department of Obstetrics and Gynecology  [facility]

## 2022-01-13 ENCOUNTER — Telehealth: Payer: Self-pay | Admitting: *Deleted

## 2022-01-13 NOTE — Telephone Encounter (Signed)
Per Dr Berline Lopes scheduled the patient for a MRI scan and MD visit to follow. Appts scheduled and patient given appt dates/times    MRI 9/14 at 10 am  Dr Berline Lopes 9/15 at 1:15 pm

## 2022-03-05 ENCOUNTER — Telehealth: Payer: Self-pay | Admitting: *Deleted

## 2022-03-05 NOTE — Telephone Encounter (Signed)
Rescheduled the patient's appt from 9/28 to 9/29

## 2022-04-15 ENCOUNTER — Ambulatory Visit: Payer: Managed Care, Other (non HMO)

## 2022-04-16 ENCOUNTER — Ambulatory Visit: Payer: Managed Care, Other (non HMO) | Admitting: Gynecologic Oncology

## 2022-04-22 ENCOUNTER — Ambulatory Visit
Admission: RE | Admit: 2022-04-22 | Discharge: 2022-04-22 | Disposition: A | Discharge status: Home / Self Care | Payer: Managed Care, Other (non HMO) | Source: Ambulatory Visit | Attending: Gynecologic Oncology | Admitting: Gynecologic Oncology

## 2022-04-22 DIAGNOSIS — N809 Endometriosis, unspecified: Secondary | ICD-10-CM | POA: Insufficient documentation

## 2022-04-22 MED ORDER — GADOPICLENOL 0.5 MMOL/ML IV SOLN
7.0000 mL | Freq: Once | INTRAVENOUS | Status: AC | PRN
Start: 2022-04-22 — End: 2022-04-22
  Administered 2022-04-22: 7 mL via INTRAVENOUS

## 2022-04-29 ENCOUNTER — Encounter: Payer: Self-pay | Admitting: Gynecologic Oncology

## 2022-04-29 ENCOUNTER — Ambulatory Visit: Payer: Managed Care, Other (non HMO) | Admitting: Gynecologic Oncology

## 2022-04-30 ENCOUNTER — Encounter: Payer: Self-pay | Admitting: Gynecologic Oncology

## 2022-04-30 ENCOUNTER — Inpatient Hospital Stay: Payer: Managed Care, Other (non HMO) | Attending: Gynecologic Oncology | Admitting: Gynecologic Oncology

## 2022-04-30 ENCOUNTER — Other Ambulatory Visit: Payer: Self-pay

## 2022-04-30 ENCOUNTER — Inpatient Hospital Stay: Payer: Managed Care, Other (non HMO)

## 2022-04-30 VITALS — BP 164/92 | HR 80 | Temp 98.4°F | Resp 16 | Ht 63.0 in | Wt 184.0 lb

## 2022-04-30 DIAGNOSIS — R399 Unspecified symptoms and signs involving the genitourinary system: Secondary | ICD-10-CM | POA: Diagnosis not present

## 2022-04-30 DIAGNOSIS — Z90710 Acquired absence of both cervix and uterus: Secondary | ICD-10-CM | POA: Insufficient documentation

## 2022-04-30 DIAGNOSIS — N80552 Deep endometriosis of other parts of the colon: Secondary | ICD-10-CM

## 2022-04-30 DIAGNOSIS — I10 Essential (primary) hypertension: Secondary | ICD-10-CM

## 2022-04-30 DIAGNOSIS — R300 Dysuria: Secondary | ICD-10-CM | POA: Insufficient documentation

## 2022-04-30 DIAGNOSIS — R519 Headache, unspecified: Secondary | ICD-10-CM | POA: Insufficient documentation

## 2022-04-30 DIAGNOSIS — N809 Endometriosis, unspecified: Secondary | ICD-10-CM | POA: Insufficient documentation

## 2022-04-30 DIAGNOSIS — Z793 Long term (current) use of hormonal contraceptives: Secondary | ICD-10-CM

## 2022-04-30 LAB — URINALYSIS, COMPLETE (UACMP) WITH MICROSCOPIC
Bacteria, UA: NONE SEEN
Bilirubin Urine: NEGATIVE
Glucose, UA: NEGATIVE mg/dL
Ketones, ur: NEGATIVE mg/dL
Leukocytes,Ua: NEGATIVE
Nitrite: NEGATIVE
Protein, ur: NEGATIVE mg/dL
Specific Gravity, Urine: 1.015 (ref 1.005–1.030)
pH: 7 (ref 5.0–8.0)

## 2022-04-30 NOTE — Progress Notes (Signed)
Gynecologic Oncology Return Clinic Visit  04/30/2022  Reason for Visit: Surveillance visit in the setting of stage IV endometriosis  Treatment History: The patient has a history of supracervical hysterectomy via laparotomy in August 2013. Significant pelvic adhesions with the tubes and ovaries were noted at that time and these structures were not removed. She carried a diagnosis of endometriosis, however her hysterectomy was performed because of symptomatic uterine fibroids. Postoperatively she developed persistent cyclical light vaginal spotting. She underwent her first postoperative ultrasound 2014 per patient which she states revealed a cyst on one of her ovaries. As part of follow-up for this cyst the ultrasound was repeated in February 2015, and showed persistent and stable size of the cyst. Her bleeding was attributed to residual endometrial glands in the cervical stump. She denies pelvic pain or symptoms from ovarian cysts.    She was seen for her scheduled follow-up in September 2016 and underwent a repeat ultrasound scan of Dr. Mancel Bale office on 04/22/2015. This showed surgically absent uterus. The right ovary contained a simple appearing cystic mass measuring 4.8 x 2.7 x 2.8 cm and this was slightly increased in size from previous scan. No flow was seen to the area oncology Doppler. The left ovary contained 2 separate complex cystic areas one measuring 3.8 x 2 x 2.9 cm and the other merit measuring 3 x 1.7 x 2.8 cm. No flow was seen to these areas oncology Doppler. These were described as possible endometriomas. A CA-125 was drawn on 04/30/2015 and was mildly elevated at 41 units per milliliter.   The patient was initially see in our clinic at this time for consultation with recommendation for close surveillance.   Repeat ultrasound on 03/10/2017 showed cervix normal to be normal in appearance. The right ovary contained a similar appearing nonvascular mass that appeared cystic with low level  echoes throughout. Was felt to likely be an endometrioma measuring 3.9 x 2.8 x 3.5 cm. Previously was 3.1 x 3 x 3 cm. The left ovary contained a simple appearing cyst measuring 3.1 x 3.2 x 3.6 cm. The previously described masses on either ultrasounds have resolved. There is no free fluid. The performed another CA-125 that was 93.    The patient was seen again in our clinic for consultation with continued surveillance recommended.   More recently, pelvic ultrasound on 04/28/2021 showed previously measured right endometrioma now 2.3 x 1.8 x 2.2 cm.  Complex cystic structure seen on right ovary measures 6.8 x 3.2 x 4.2 cm.  Left ovary appears normal.  No free fluid noted in the cul-de-sac. CA-125 on 04/28/21 was 85.6.   Pelvic MRI on 06/04/2021 showed cystic and solid area, mainly cystic measuring 8.2 x 5.9 x 8.1 cm.  More solid-appearing area along the ventral surface of the colon is contiguous with the cervix measuring 19 x 15 mm.  Dominant cystic area to the right of midline shows shading on T2 weighted images and is markedly hyperintense on T1-weighted images with numerous other smaller cystic areas displaying similar bladder features.  Dominant area measures 6.7 x 4.3 cm.  Ovarian vessels can be followed into this process on both the left and the right.  Signs of pelvic endometriosis in addition to cystic endometriomas and deep pelvic infiltrating disease which tethers the cervix, colon, ovaries, and adjacent small bowel.  Area of marked hyperenhancement which is more so than adjacent small bowel with nodular area along the ventral surface of the rectum, no gross evidence of restricted diffusion in this area  but enhancement more so than would be expected for simple deep infiltrating disease, raising the question of malignant transformation.   The patient establish care with me in November 2022.  At that time, her pelvic pain had completely resolved as of starting norethindrone about a month prior to seeing  me.     Previously, the patient was on [employer] for about a year.  She had significant side effects related to the medication but had no pelvic pain or bleeding.   She was seen at [facility] for colonoscopy on 06/24/21.  There is a least one area biopsied that she reports the gastroenterologist did not think was cancer.  It sounds like there were some findings of endometriosis invading through the mucosa.  After multiple consultations, she decided against surgical treatment and started norethindrone.  MRI of the pelvis on 10/05/2021 showed no significant change in a poorly defined soft tissue density involving the cervix and rectum, causing tethering of pelvic small bowel loop consistent with endometriosis.  Small endometriomas in bilateral adnexa, mildly decreased in size since previous study.  No evidence of new or progressive disease within the pelvis.  Interval History: Patient reports overall doing well.  Has had about 30 days of symptoms including feeling some pressure in her bladder, sometimes like she is not emptying all the way, and some intermittent dysuria.  Denies any vaginal bleeding, pelvic pain or cramping.  Her bowel function is normal as long as she is using medication to avoid constipation.  She reports having a slight headache this morning, currently asymptomatic.  Took her blood pressure medicine this morning.  Notes that her blood pressure intermittently spikes.  Was just seen for her annual exam, had a Pap test.  Past Medical/Surgical History: Past Medical History:  Diagnosis Date   Abnormal Pap smear 2004   colpo./leep   Bacterial infection    BV (bacterial vaginosis) 12/2005   Chlamydial infection    Dysplasia of cervix, low grade (CIN 1) 05/31/11   Fibroid 01/10/06   GERD (gastroesophageal reflux disease)    no medication   H/O amenorrhea    H/O dysmenorrhea 09/28/07   H/O measles    H/O varicella    Hx of colposcopy with cervical biopsy    Irregular  menses    Menometrorrhagia 09/23/06   Trichomonas    Urinary retention with incomplete bladder emptying    Yeast infection     Past Surgical History:  Procedure Laterality Date   ABDOMINAL HYSTERECTOMY  03/02/2012   Procedure: HYSTERECTOMY ABDOMINAL;  Surgeon: Delice Lesch, MD;  Location: [facility];  Service: Gynecology;  Laterality: N/A;   CERVICAL BIOPSY  W/ LOOP ELECTRODE EXCISION     before hysterectomy - normal paps since   CESAREAN SECTION     CYSTOSCOPY  03/02/2012   Procedure: CYSTOSCOPY;  Surgeon: Delice Lesch, MD;  Location: [facility];  Service: Gynecology;  Laterality: N/A;   DILATION AND CURETTAGE OF UTERUS      Family History  Problem Relation Age of Onset   Hypertension Mother    Colon cancer Father    Hypertension Maternal Aunt    Hypertension Maternal Grandmother    Hypertension Maternal Grandfather    Diabetes Maternal Grandfather    Prostate cancer Neg Hx    Pancreatic cancer Neg Hx    Ovarian cancer Neg Hx    Endometrial cancer Neg Hx    Breast cancer Neg Hx     Social History   Socioeconomic History  Marital status: Single    Spouse name: Not on file   Number of children: Not on file   Years of education: Not on file   Highest education level: Not on file  Occupational History   Not on file  Tobacco Use   Smoking status: Never   Smokeless tobacco: Never  Substance and Sexual Activity   Alcohol use: No   Drug use: No   Sexual activity: Yes    Birth control/protection: Condom  Other Topics Concern   Not on file  Social History Narrative   Not on file   Social Determinants of Health   Financial Resource Strain: Not on file  Food Insecurity: Not on file  Transportation Needs: Not on file  Physical Activity: Not on file  Stress: Not on file  Social Connections: Not on file    Current Medications:  Current Outpatient Medications:    naproxen (NAPRELAN) 500 MG 24 hr tablet, naproxen sodium ER (CR) 500 mg tablet,extended release 24  hr mphase  TAKE 2 TABLETS BY MOUTH EVERY DAY AS NEEDED, Disp: , Rfl:    nebivolol (BYSTOLIC) 2.5 MG tablet, Take 2.5 mg by mouth daily., Disp: , Rfl:    nebivolol (BYSTOLIC) 5 MG tablet, Take 1 tablet by mouth daily., Disp: , Rfl:    norethindrone (AYGESTIN) 5 MG tablet, norethindrone acetate 5 mg tablet  TAKE 2 TABLETS BY MOUTH EVERY DAY, Disp: , Rfl:    valACYclovir (VALTREX) 500 MG tablet, Take 500 mg by mouth daily., Disp: , Rfl:   Review of Systems: Denies appetite changes, fevers, chills, fatigue, unexplained weight changes. Denies hearing loss, neck lumps or masses, mouth sores, ringing in ears or voice changes. Denies cough or wheezing.  Denies shortness of breath. Denies chest pain or palpitations. Denies leg swelling. Denies abdominal distention, pain, blood in stools, constipation, diarrhea, nausea, vomiting, or early satiety. Denies pain with intercourse, dysuria, frequency, hematuria or incontinence. Denies hot flashes, pelvic pain, vaginal bleeding or vaginal discharge.   Denies joint pain, back pain or muscle pain/cramps. Denies itching, rash, or wounds. Denies dizziness, headaches, numbness or seizures. Denies swollen lymph nodes or glands, denies easy bruising or bleeding. Denies anxiety, depression, confusion, or decreased concentration.  Physical Exam: BP (!) 164/92 Comment: manual recheck (Dr. Berline Lopes aware)  Pulse 80   Temp 98.4 F (36.9 C) (Oral)   Resp 16   Ht '5\' 3"'$  (1.6 m)   Wt 184 lb (83.5 kg)   LMP 03/02/2012   BMI 32.59 kg/m  General: Alert, oriented, no acute distress. HEENT: Normocephalic, atraumatic, sclera anicteric. Chest: Unlabored breathing on room air.  Laboratory & Radiologic Studies: MRI pelvis 04/22/22: 1. Mild interval regression of enhancing nodular deep infiltrating endometriosis in the midline deep pelvis at the supracervical hysterectomy margin, intimately associated with and tethering the anterior rectal wall and bilateral adnexal  regions. 2. Continued interval regression of bilateral adnexal endometriomas. 3. No new or progressive sites of disease in the pelvis. No pelvic adenopathy.  Assessment & Plan: Casey Lloyd is a 46 y.o. woman with Stage IV endometriosis who presents for follow-up in the setting of medical management.  Patient is overall doing very well in the setting of her endometriosis.  We discussed recent MRI, which shows regression of pelvic endometriosis findings.  This is overall very reassuring that she does not have malignant transformation.  She is doing very well on norethindrone, tolerating medication without any significant side effects.  I would recommend that she continue on this  as long as she continues to derive benefit.  We discussed that there are other hormonal medications for treatment of endometriosis if she were to develop symptoms.  I do not think that she needs additional MRI imaging.  If she were to start having new symptoms, she may benefit from ultrasound and/or MRI.  She and I discussed that I do not think there is a need for her to continue seeing me at this time.  I will send a note to Dr. Mancel Bale releasing her back for continued medical management of her endometriosis.  Patient is aware that she can call me if any needs arise in the future.  Given her urinary symptoms, I will plan to send urinalysis and urine culture today.  Discussed importance of drinking plenty of fluid and that she could try Azo over-the-counter for a couple of days to see if this helps with her symptoms.  In terms of her blood pressure, she is asymptomatic at this time.  She is able to check her blood pressure at home.  I have urged her to check her blood pressure when she gets home.  If she were to develop any symptoms, she should present immediately for care.  I have also encouraged her to reach out to her primary care provider to see if she needs any change in hypertensive medication.  24 minutes of total  time was spent for this patient encounter, including preparation, face-to-face counseling with the patient and coordination of care, and documentation of the encounter.  Jeral Pinch, MD  Division of Gynecologic Oncology  Department of Obstetrics and Gynecology  [facility] [place] [facility]

## 2022-04-30 NOTE — Patient Instructions (Signed)
It was good to see you today.  Please keep taking the norethindrone.  I am going to let your OB/GYN know that you can continue to follow with her.  I do not think you need any additional MRIs unless something were to change in terms of your symptoms.  Please keep checking your blood pressure at home and let your primary care doctor know if you are blood pressure continues to be elevated.  If you develop any symptoms again, such as vision changes or headache, please seek care immediately.  Given your urinary symptoms, we will get urine culture today to make sure there is no urine infection.  You can try some Azo over-the-counter to see if this helps.  Please make sure you are drinking plenty of fluid.

## 2022-05-01 LAB — URINE CULTURE

## 2022-05-03 ENCOUNTER — Other Ambulatory Visit: Payer: Self-pay

## 2022-05-03 ENCOUNTER — Telehealth: Payer: Self-pay

## 2022-05-03 DIAGNOSIS — R399 Unspecified symptoms and signs involving the genitourinary system: Secondary | ICD-10-CM

## 2022-05-03 NOTE — Telephone Encounter (Signed)
Per Dr. Berline Lopes, pt is aware may come in anytime for a urine. Pt states she will come in tomorrow d/t work schedule today.   Urine culture ordered, will call with results.

## 2022-05-03 NOTE — Telephone Encounter (Signed)
Spoke with patient and gave her information that UA was negative.  She stated had no pain just feels like she's not emptying all the way.  Wanted to know how long to wait and do another UA.  Gave message to Kim Martinique to speak with her.

## 2022-05-03 NOTE — Telephone Encounter (Signed)
LVM to follow up with patient from urinary S&S. Per Berline Lopes, UA was negative but culture shows contamination. If still symptomatic, we should have her come in for another sample.

## 2022-05-04 ENCOUNTER — Inpatient Hospital Stay: Payer: Managed Care, Other (non HMO) | Attending: Gynecologic Oncology

## 2022-05-04 DIAGNOSIS — N809 Endometriosis, unspecified: Secondary | ICD-10-CM | POA: Diagnosis present

## 2022-05-04 DIAGNOSIS — R399 Unspecified symptoms and signs involving the genitourinary system: Secondary | ICD-10-CM | POA: Insufficient documentation

## 2022-05-06 ENCOUNTER — Telehealth: Payer: Self-pay

## 2022-05-06 LAB — URINE CULTURE: Culture: <10000 — AB

## 2022-05-06 NOTE — Telephone Encounter (Signed)
Pt returning call. States she is feeling much better with no S&S of UTI. Pt aware of urinalysis results.

## 2022-05-06 NOTE — Telephone Encounter (Signed)
LVM for patient to call office for an update on how she is feeling with S&S of UTI. Results of urinalysis show no infection
